# Patient Record
Sex: Male | Born: 1945 | Hispanic: No | Marital: Married | Smoking: Former smoker
Health system: Southern US, Community
[De-identification: ages and names within clinical notes are randomized; demographics above are authoritative.]

## PROBLEM LIST (undated history)

## (undated) DIAGNOSIS — I2510 Atherosclerotic heart disease of native coronary artery without angina pectoris: Secondary | ICD-10-CM

## (undated) DIAGNOSIS — I447 Left bundle-branch block, unspecified: Secondary | ICD-10-CM

## (undated) DIAGNOSIS — I059 Rheumatic mitral valve disease, unspecified: Secondary | ICD-10-CM

## (undated) DIAGNOSIS — I255 Ischemic cardiomyopathy: Secondary | ICD-10-CM

## (undated) DIAGNOSIS — I5022 Chronic systolic (congestive) heart failure: Secondary | ICD-10-CM

## (undated) DIAGNOSIS — Z9119 Patient's noncompliance with other medical treatment and regimen: Secondary | ICD-10-CM

## (undated) DIAGNOSIS — R7302 Impaired glucose tolerance (oral): Secondary | ICD-10-CM

## (undated) DIAGNOSIS — Z91199 Patient's noncompliance with other medical treatment and regimen due to unspecified reason: Secondary | ICD-10-CM

## (undated) DIAGNOSIS — E785 Hyperlipidemia, unspecified: Secondary | ICD-10-CM

## (undated) DIAGNOSIS — I10 Essential (primary) hypertension: Secondary | ICD-10-CM

## (undated) HISTORY — DX: Impaired glucose tolerance (oral): R73.02

## (undated) HISTORY — DX: Patient's noncompliance with other medical treatment and regimen: Z91.19

## (undated) HISTORY — DX: Left bundle-branch block, unspecified: I44.7

## (undated) HISTORY — DX: Rheumatic mitral valve disease, unspecified: I05.9

## (undated) HISTORY — PX: MITRAL VALVE REPAIR: SHX2039

## (undated) HISTORY — DX: Ischemic cardiomyopathy: I25.5

## (undated) HISTORY — DX: Atherosclerotic heart disease of native coronary artery without angina pectoris: I25.10

## (undated) HISTORY — DX: Hyperlipidemia, unspecified: E78.5

## (undated) HISTORY — DX: Chronic systolic (congestive) heart failure: I50.22

## (undated) HISTORY — PX: APPENDECTOMY: SHX54

## (undated) HISTORY — DX: Essential (primary) hypertension: I10

## (undated) HISTORY — DX: Patient's noncompliance with other medical treatment and regimen due to unspecified reason: Z91.199

---

## 2003-12-17 HISTORY — PX: CORONARY ARTERY BYPASS GRAFT: SHX141

## 2011-08-09 ENCOUNTER — Emergency Department (HOSPITAL_COMMUNITY): Payer: 59

## 2011-08-09 ENCOUNTER — Inpatient Hospital Stay (HOSPITAL_COMMUNITY)
Admission: EM | Admit: 2011-08-09 | Discharge: 2011-08-11 | DRG: 293 | Disposition: A | Discharge status: Home / Self Care | Payer: 59 | Attending: Family Medicine | Admitting: Family Medicine

## 2011-08-09 DIAGNOSIS — I10 Essential (primary) hypertension: Secondary | ICD-10-CM

## 2011-08-09 DIAGNOSIS — I25810 Atherosclerosis of coronary artery bypass graft(s) without angina pectoris: Secondary | ICD-10-CM

## 2011-08-09 DIAGNOSIS — Z9119 Patient's noncompliance with other medical treatment and regimen: Secondary | ICD-10-CM

## 2011-08-09 DIAGNOSIS — I2510 Atherosclerotic heart disease of native coronary artery without angina pectoris: Secondary | ICD-10-CM | POA: Diagnosis present

## 2011-08-09 DIAGNOSIS — Z91199 Patient's noncompliance with other medical treatment and regimen due to unspecified reason: Secondary | ICD-10-CM

## 2011-08-09 DIAGNOSIS — Z951 Presence of aortocoronary bypass graft: Secondary | ICD-10-CM

## 2011-08-09 DIAGNOSIS — I447 Left bundle-branch block, unspecified: Secondary | ICD-10-CM | POA: Diagnosis present

## 2011-08-09 DIAGNOSIS — I509 Heart failure, unspecified: Secondary | ICD-10-CM

## 2011-08-09 DIAGNOSIS — I5023 Acute on chronic systolic (congestive) heart failure: Principal | ICD-10-CM | POA: Diagnosis present

## 2011-08-09 LAB — URINALYSIS, ROUTINE W REFLEX MICROSCOPIC
Bilirubin Urine: NEGATIVE
Ketones, ur: NEGATIVE mg/dL
Nitrite: NEGATIVE
Protein, ur: NEGATIVE mg/dL
Specific Gravity, Urine: 1.009 (ref 1.005–1.030)
Urobilinogen, UA: 0.2 mg/dL (ref 0.0–1.0)

## 2011-08-09 LAB — COMPREHENSIVE METABOLIC PANEL
ALT: 34 U/L (ref 0–53)
CO2: 28 mEq/L (ref 19–32)
Calcium: 9.1 mg/dL (ref 8.4–10.5)
GFR calc Af Amer: >60 mL/min (ref >60)
GFR calc non Af Amer: >60 mL/min (ref >60)
Glucose, Bld: 114 mg/dL — ABNORMAL HIGH (ref 70–99)
Sodium: 140 mEq/L (ref 135–145)
Total Bilirubin: 0.5 mg/dL (ref 0.3–1.2)

## 2011-08-09 LAB — CBC
Platelets: 214 10*3/uL (ref 150–400)
RDW: 17.1 % — ABNORMAL HIGH (ref 11.5–15.5)
WBC: 8.1 10*3/uL (ref 4.0–10.5)

## 2011-08-09 LAB — DIFFERENTIAL
Basophils Absolute: 0 10*3/uL (ref 0.0–0.1)
Eosinophils Absolute: 0 10*3/uL (ref 0.0–0.7)
Eosinophils Relative: 0 % (ref 0–5)

## 2011-08-09 LAB — PROTIME-INR: INR: 1.17 (ref 0.00–1.49)

## 2011-08-09 LAB — GLUCOSE, CAPILLARY

## 2011-08-09 LAB — APTT: aPTT: 32 seconds (ref 24–37)

## 2011-08-09 NOTE — H&P (Signed)
Irish Piech is an 65 y.o. male.   Allergies: NKDA Code Status: Full Code  Chief Complaint: Shortness of Breath  HPI: Mr. Addie presents with dyspnea that has been present for approximately 1.5 months; he has a history of prior CABG but no current medical care.  He had a worsening of his breathing and swelling today that prompted him to present to urgent care and he was subsequently transferred to [facility] ED for further evaluation.  He states that he noticed that his symptoms were worsening over the past week when he was trying to walk to a connecting flight while traveling for work.  He travels frequently for his job.  He denies any hemoptysis but does report that his left leg seemed to be swollen before the right leg at the onset of his symptoms but that it seems to be better now.    Review of Systems  Constitutional: Negative for fever, chills, weight loss, malaise/fatigue and diaphoresis.  HENT: Negative for hearing loss.   Eyes: Negative.   Respiratory: Positive for cough, sputum production and shortness of breath. Negative for hemoptysis and wheezing.        Clear sputum production chronically; has improved following azithromycin 2 weeks ago  Cardiovascular: Negative for chest pain, palpitations and orthopnea.       Dyspnea better with laying  Gastrointestinal: Negative for nausea, vomiting, abdominal pain, diarrhea and constipation.  Genitourinary: Negative for dysuria, urgency and frequency.  Musculoskeletal: Negative.   Skin: Negative.   Neurological: Negative for dizziness, tingling, tremors, focal weakness, weakness and headaches.  Endo/Heme/Allergies: Negative.   Psychiatric/Behavioral: Negative.     PMHx: CAD s/p CABG and ? valvuoplasty HTN - not currently treated Does not currently have medical care but was to see [facility]iology in October to establish.  Current Medications: No current daily meds  Tylenol PM prn sleep Recently finished course of Azithromycin for  URI ~2 weeks ago   PSHx: 4 Vessel CABG with valvuoplasty - performed in [place] - Oct 2007 Appendectomy  PFHx: + Early cardiac disease - CA in his sister - No diabetes - No clotting disorders  Social Hx: Lives in both [place] and [place] frequently for work Drinks causally - smoker as a teenager but non in past 45 years; no current illicit but does admit to prior use during the 1970s.  OBJECTIVE: Vitals: 98.4oF 171/172mmHg 101bpm 18resp  97% on 2 L  Physical Exam  Constitutional: He is oriented to person, place, and time. He appears well-developed and well-nourished. No distress.  HENT:  Head: Normocephalic and atraumatic.  Eyes: EOM are normal. Pupils are equal, round, and reactive to light. No scleral icterus.  Neck: Neck supple. No JVD present. No tracheal deviation present. No thyromegaly present.  Cardiovascular: Normal rate.  Exam reveals gallop and S4.   Murmur heard.  Systolic murmur is present with a grade of 2/6       Murmur heard best at left upper sternal boarder   Respiratory: Effort normal. No stridor. No respiratory distress. He has no wheezes.       Bibasilar Crackles   GI: Soft. Bowel sounds are normal. He exhibits no distension and no mass. There is no tenderness. There is no rebound and no guarding.  Musculoskeletal: He exhibits edema. He exhibits no tenderness.       Negative homans Left leg with 3+/4 edema R Leg with 2+/4  Pulses 2+/4 B  Neurological: He is alert and oriented to person, place,  and time.  Skin: Skin is warm and dry. He is not diaphoretic.  Psychiatric: He has a normal mood and affect. His behavior is normal.     Results for orders placed during the hospital encounter of 08/09/11 (from the past 48 hour(s))  DIFFERENTIAL     Status: Abnormal   Collection Time   08/09/11  7:11 PM      Component Value Range Comment   Neutrophils Relative 81 (*) 43 - 77 (%)    Neutro Abs 6.6  1.7 - 7.7 (K/uL)    Lymphocytes Relative 14   12 - 46 (%)    Lymphs Abs 1.1  0.7 - 4.0 (K/uL)    Monocytes Relative 4  3 - 12 (%)    Monocytes Absolute 0.4  0.1 - 1.0 (K/uL)    Eosinophils Relative 0  0 - 5 (%)    Eosinophils Absolute 0.0  0.0 - 0.7 (K/uL)    Basophils Relative 0  0 - 1 (%)    Basophils Absolute 0.0  0.0 - 0.1 (K/uL)   CBC     Status: Abnormal   Collection Time   08/09/11  7:11 PM      Component Value Range Comment   WBC 8.1  4.0 - 10.5 (K/uL)    RBC 4.80  4.22 - 5.81 (MIL/uL)    Hemoglobin 13.8  13.0 - 17.0 (g/dL)    HCT 16.1  09.6 - 04.5 (%)    MCV 86.3  78.0 - 100.0 (fL)    MCH 28.8  26.0 - 34.0 (pg)    MCHC 33.3  30.0 - 36.0 (g/dL)    RDW 40.9 (*) 81.1 - 15.5 (%)    Platelets 214  150 - 400 (K/uL)   PROTIME-INR     Status: Normal   Collection Time   08/09/11  7:11 PM      Component Value Range Comment   Prothrombin Time 15.1  11.6 - 15.2 (seconds)    INR 1.17  0.00 - 1.49    APTT     Status: Normal   Collection Time   08/09/11  7:11 PM      Component Value Range Comment   aPTT 32  24 - 37 (seconds)   PRO B NATRIURETIC PEPTIDE     Status: Abnormal   Collection Time   08/09/11  7:11 PM      Component Value Range Comment   BNP, POC 4558.0 (*) 0 - 125 (pg/mL)   COMPREHENSIVE METABOLIC PANEL     Status: Abnormal   Collection Time   08/09/11  7:11 PM      Component Value Range Comment   Sodium 140  135 - 145 (mEq/L)    Potassium 4.6  3.5 - 5.1 (mEq/L)    Chloride 104  96 - 112 (mEq/L)    CO2 28  19 - 32 (mEq/L)    Glucose, Bld 114 (*) 70 - 99 (mg/dL)    BUN 29 (*) 6 - 23 (mg/dL)    Creatinine, Ser 9.14  0.50 - 1.35 (mg/dL)    Calcium 9.1  8.4 - 10.5 (mg/dL)    Total Protein 6.3  6.0 - 8.3 (g/dL)    Albumin 3.2 (*) 3.5 - 5.2 (g/dL)    AST 33  0 - 37 (U/L)    ALT 34  0 - 53 (U/L)    Alkaline Phosphatase 102  39 - 117 (U/L)    Total Bilirubin 0.5  0.3 - 1.2 (mg/dL)  GFR calc non Af Amer >60  >60 (mL/min)    GFR calc Af Amer >60  >60 (mL/min)   POCT I-STAT TROPONIN I     Status: Normal    Collection Time   08/09/11  7:38 PM      Component Value Range Comment   Troponin i, poc 0.02  0.00 - 0.08 (ng/mL)    Comment 3            URINALYSIS, ROUTINE W REFLEX MICROSCOPIC     Status: Normal   Collection Time   08/09/11  8:07 PM      Component Value Range Comment   Color, Urine YELLOW  YELLOW     Appearance CLEAR  CLEAR     Specific Gravity, Urine 1.009  1.005 - 1.030     pH 6.0  5.0 - 8.0     Glucose, UA NEGATIVE  NEGATIVE (mg/dL)    Hgb urine dipstick NEGATIVE  NEGATIVE     Bilirubin Urine NEGATIVE  NEGATIVE     Ketones, ur NEGATIVE  NEGATIVE (mg/dL)    Protein, ur NEGATIVE  NEGATIVE (mg/dL)    Urobilinogen, UA 0.2  0.0 - 1.0 (mg/dL)    Nitrite NEGATIVE  NEGATIVE     Leukocytes, UA NEGATIVE  NEGATIVE  MICROSCOPIC NOT DONE ON URINES WITH NEGATIVE PROTEIN, BLOOD, LEUKOCYTES, NITRITE, OR GLUCOSE <1000 mg/dL.   Dg Chest 2 View  08/09/2011  *RADIOLOGY REPORT*  Clinical Data: Shortness of breath, chest tightness.  CHEST - 2 VIEW  Comparison: Outside film performed earlier today.  Findings: Prior median sternotomy and valve replacement/CABG. There is cardiomegaly.  Bibasilar opacities are noted with small bilateral effusions.  Cannot exclude pneumonia.  No overt edema. No acute bony abnormality.  No change.  IMPRESSION: Cardiomegaly.  No overt edema.  Bibasilar atelectasis or infiltrates with small bilateral effusions.  Original Report Authenticated By: Cyndie Chime, M.D.   ECG Interpretation:  NSR, with ? Inferior and lateral ischemia with J point depression   Assessment/Plan Mr. Alfonso is a 65 yo male admitted with Congestive Heart Failure with a history significant for 4 vessel CABG and valvuloplasty.  He does not currently have any medical care.  1. Congestive Heart Failure - BNP elevated to 4558 - new diagnosis  * Lasix 60mg  IV   * Nitroglycerin 0.5inch paste q 6o  * Saline Locked IV  [ ]  2D ECHO with Contrast  - Consult Cardiology in AM    2. Dyspnea - most likely  related to CHF but consider DVT in setting of lateralization of swelling and frequent travel and acuity of sx worsening following air travel  [ ]  D-Dimer - if positive CTA and LE Dopplers   3. HTN - Currently elevated - no meds as OP.     *Nitro patch  4. CAD s/p CABG - Rule out MI - negative CEs X 1 - ECG shows ?ischemia in inferior and lateral leads  [ ]  Repeat ECG in AM  [ ]  CE cycling   * ASA   5. Risk Stratification  [ ]  HbA1c  [ ]  Fasting Lipid Panel  [ ]  TSH  FEN -  - Saline Locked IV  - Diet Clears  PPx:  Heparin 5000 Units sq tid  Disposition:  Will consider Cardiology consult in Morning - he is to be a [facility] pt; will risk stratify and disscuss appropriate medical management with the patient  Million Maharaj 08/09/2011, 10:44 PM

## 2011-08-10 ENCOUNTER — Inpatient Hospital Stay (HOSPITAL_COMMUNITY): Payer: 59

## 2011-08-10 DIAGNOSIS — I509 Heart failure, unspecified: Secondary | ICD-10-CM

## 2011-08-10 LAB — CARDIAC PANEL(CRET KIN+CKTOT+MB+TROPI)
CK, MB: 3.2 ng/mL (ref 0.3–4.0)
Relative Index: 3.2 — ABNORMAL HIGH (ref 0.0–2.5)
Total CK: 73 U/L (ref 7–232)
Troponin I: <0.3 ng/mL (ref <0.30)

## 2011-08-10 LAB — GLUCOSE, CAPILLARY: Glucose-Capillary: 70 mg/dL (ref 70–99)

## 2011-08-10 LAB — BASIC METABOLIC PANEL
BUN: 22 mg/dL (ref 6–23)
Creatinine, Ser: 0.93 mg/dL (ref 0.50–1.35)
GFR calc Af Amer: >60 mL/min (ref >60)
GFR calc non Af Amer: >60 mL/min (ref >60)
Glucose, Bld: 83 mg/dL (ref 70–99)

## 2011-08-10 LAB — TSH: TSH: 3.166 u[IU]/mL (ref 0.350–4.500)

## 2011-08-10 LAB — CBC
Platelets: 201 10*3/uL (ref 150–400)
RDW: 16.9 % — ABNORMAL HIGH (ref 11.5–15.5)
WBC: 7.3 10*3/uL (ref 4.0–10.5)

## 2011-08-10 LAB — D-DIMER, QUANTITATIVE: D-Dimer, Quant: 1.15 ug/mL-FEU — ABNORMAL HIGH (ref 0.00–0.48)

## 2011-08-10 LAB — LIPID PANEL: LDL Cholesterol: 108 mg/dL — ABNORMAL HIGH (ref 0–99)

## 2011-08-10 LAB — PHOSPHORUS: Phosphorus: 4.1 mg/dL (ref 2.3–4.6)

## 2011-08-10 LAB — MAGNESIUM: Magnesium: 2.2 mg/dL (ref 1.5–2.5)

## 2011-08-10 LAB — HEMOGLOBIN A1C: Mean Plasma Glucose: 137 mg/dL — ABNORMAL HIGH (ref <117)

## 2011-08-10 MED ORDER — IOHEXOL 300 MG/ML  SOLN
100.0000 mL | Freq: Once | INTRAMUSCULAR | Status: AC | PRN
  Administered 2011-08-10: 100 mL via INTRAVENOUS

## 2011-08-11 ENCOUNTER — Inpatient Hospital Stay (HOSPITAL_COMMUNITY): Payer: 59

## 2011-08-11 DIAGNOSIS — I5023 Acute on chronic systolic (congestive) heart failure: Secondary | ICD-10-CM

## 2011-08-11 LAB — BASIC METABOLIC PANEL
BUN: 17 mg/dL (ref 6–23)
Creatinine, Ser: 0.88 mg/dL (ref 0.50–1.35)
GFR calc non Af Amer: >60 mL/min (ref >60)
Glucose, Bld: 95 mg/dL (ref 70–99)
Potassium: 3.8 mEq/L (ref 3.5–5.1)

## 2011-08-11 LAB — URINE CULTURE: Culture  Setup Time: 201208250208

## 2011-08-11 LAB — CBC
HCT: 41.7 % (ref 39.0–52.0)
Hemoglobin: 13.6 g/dL (ref 13.0–17.0)
MCH: 27.9 pg (ref 26.0–34.0)
MCHC: 32.6 g/dL (ref 30.0–36.0)
MCV: 85.6 fL (ref 78.0–100.0)
RDW: 16.9 % — ABNORMAL HIGH (ref 11.5–15.5)

## 2011-08-12 ENCOUNTER — Telehealth: Payer: Self-pay | Admitting: Internal Medicine

## 2011-08-12 NOTE — Telephone Encounter (Signed)
Pt's wife calling to say pt was to see dr allred this week after being in hospital this weekend, also has questions re back pain

## 2011-08-12 NOTE — Telephone Encounter (Signed)
To see Tereso Newcomer 1-2 weeks -per Dr Amedeo Plenty note

## 2011-08-12 NOTE — Telephone Encounter (Signed)
Pt can see Jacolyn Reedy PA on 08/21/11 per Dr Johney Frame  Back is better today after wife rubbed it  Thinks is due to hospital bed

## 2011-08-12 NOTE — Telephone Encounter (Signed)
Called pt's wife to set up appt , scott booked until 9-10, and michelle until 9-5, Pt had back/chest discomfort last night, wife rubbed his back and chest and felt better, no sweating or "pain", felt better today but concerned that he needs appt this week, also wants results of echo

## 2011-08-20 ENCOUNTER — Ambulatory Visit (INDEPENDENT_AMBULATORY_CARE_PROVIDER_SITE_OTHER): Payer: Self-pay | Admitting: Family Medicine

## 2011-08-20 DIAGNOSIS — R079 Chest pain, unspecified: Secondary | ICD-10-CM

## 2011-08-20 NOTE — Progress Notes (Signed)
  Subjective:    Patient ID: Jordan Carter, male    DOB: April 01, 1946, 65 y.o.   MRN: [id number]  HPI    Review of Systems     Objective:   Physical Exam        Assessment & Plan:  Patient was not seen..he was to be scheduled with Dr. Ashley Royalty for hospital follow up, he has an apt with cardiology already set.

## 2011-08-21 ENCOUNTER — Encounter: Payer: Self-pay | Admitting: Family Medicine

## 2011-08-22 ENCOUNTER — Inpatient Hospital Stay: Payer: Self-pay | Admitting: Family Medicine

## 2011-08-22 NOTE — Consult Note (Signed)
NAMEEBB, CARELOCK NO.:  [id number]  MEDICAL RECORD NO.:  [id number]  LOCATION:  4709                         FACILITY:  [facility]  PHYSICIAN:  Hillis Range, MD       DATE OF BIRTH:  [birth year]  DATE OF CONSULTATION: DATE OF DISCHARGE:                                CONSULTATION   REQUESTING PHYSICIAN:  Dr. Ammie Dalton.  REASON FOR CONSULTATION:  Heart failure.  HISTORY OF PRESENT ILLNESS:  Mr. Vanatta is a 65 year old gentleman with a known history of coronary artery disease and ischemic cardiomyopathy who now presents with progressive heart failure.  The patient reports having coronary artery disease and mitral valve disease for which he underwent a coronary artery bypass grafting and a mitral valve repair approximately 5 years ago in [place].  He states that by echocardiogram his ejection fraction was in the 30s at that time.  Interestingly, he has not been seen by cardiologist in approximately 4 years and takes no medications.  He states that over the past 6-8 weeks, he has had progressive shortness of breath and lower extremity edema.  He reports dyspnea with moderate activities.  He has not had significant orthopnea or PND, though he does notice a clear cough occasionally.  He has also had chronic difficulties with lower extremity edema but over the past 6 weeks he has found that this has also significantly worsened.  He denies any chest pain, palpitations, dizziness, presyncope or syncope. Presently, he is resting comfortably.  He has been initiated on intravenous Lasix and has had significant improvement in shortness of breath with diuresis.  PAST MEDICAL HISTORY: 1. Ischemic cardiomyopathy (ejection fraction previously in the 30s     per the patient). 2. Coronary artery disease status post CABG in 2005, with mitral valve     repair at that time. 3. Left bundle-branch block. 4. Hypertension. 5. Medical noncompliance.  MEDICATIONS:  None.  ALLERGIES:   None.  SOCIAL HISTORY:  The patient lives partially in [place] and partially in [place].  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 He denies tobacco or drug use and rarely drinks alcohol.  FAMILY HISTORY:  He states that multiple maternal relatives had "heart problems. "  REVIEW OF SYSTEMS:  All systems are reviewed and negative except as outlined in the HPI above.  PHYSICAL EXAMINATION:  Telemetry reveals sinus rhythm with a left bundle- branch block. VITAL SIGNS:  Blood pressure 126/72, heart rate 67, respirations 20, sats 95% on room air, afebrile. GENERAL:  The patient is a well-appearing gentleman in no acute distress.  He is alert and oriented x3. HEENT:  Normocephalic, atraumatic.  Sclerae clear.  Conjunctivae pink. Oropharynx clear. NECK:  Supple.  JVP 10 cm. LUNGS:  Few bibasilar rales.  Decreased breath sounds at the right base. Normal work of breathing. HEART:  Regular rate and rhythm.  No murmurs, rubs or gallops.  He does have a wide S2 split. GI:  Soft, nontender, nondistended.  Positive bowel sounds. EXTREMITIES:  No clubbing or cyanosis.  He does have 1+ edema bilaterally. SKIN:  No ecchymoses or lacerations. MUSCULOSKELETAL:  No deformity or atrophy. PSYCH:  Slightly bizarre  affect. NEURO:  Strength and sensation are intact.  EKG today reveals sinus rhythm with a left bundle-branch block and a QRS duration of 190 milliseconds, the QT interval measures 520 milliseconds.  Echocardiogram is pending.  LABORATORY DATA:  TSH 3.1, hemoglobin A1c 6.4.  Cardiac markers are negative.  Total cholesterol 168, triglycerides 104, HDL 39, LDL 108. Potassium 4.4, creatinine 0.9, hematocrit 40, white blood cell count 7, platelets 201.  Chest x-ray is reviewed and reveals mild interstitial edema.  I have also reviewed the patient's chest CT from August 10, 2011, which reveals no pulmonary emboli.  The patient does have bilateral pleural effusions as  well as pulmonary edema observed.  IMPRESSION:  Mr. Givan is a 65 year old gentleman with a known history of an ischemic cardiomyopathy (he previously says that his ejection fraction was in the 30s) with coronary artery disease and a history of prior coronary artery bypass grafting and mitral valve repair 5 years ago.  He does not appear to have had close Cardiology followup and is presently on no medications.  He presents with acute-on-chronic decompensated systolic congestive heart failure.  He has no symptoms of ischemia at this time.  He also has a left bundle-branch block of an unknown duration.  RECOMMENDATIONS:  At this time, I would recommend that we treat the patient for acute systolic dysfunction with gentle diuresis and sodium restriction.  He has been placed on an ACE inhibitor and I would recommend that this is titrated long term.  In addition, once his heart failure has been stabilized, I would add Coreg.  He has also been appropriately placed on a statin and I would recommend aspirin therapies long term.  We will obtain an echocardiogram to further assess his ejection fraction.  I think that he will require optimization of his medicines in the outpatient setting with a repeat echocardiogram once his medicines have been optimized for 3 months.  As the patient appears to be clinically improved, hopefully he can be discharged within the next 24 hours and the remainder of his workup and management could be performed in our office in the outpatient setting.  We will follow the patient with you during his hospital stay.     Hillis Range, MD    JA/MEDQ  D:  08/10/2011  T:  08/10/2011  Job:  [id number]  cc:   __________Dr. Ammie Dalton  Electronically Signed by Hillis Range MD on 08/22/2011 08:53:11 AM

## 2011-08-29 ENCOUNTER — Encounter: Payer: Self-pay | Admitting: Physician Assistant

## 2011-08-30 ENCOUNTER — Encounter: Payer: Self-pay | Admitting: Physician Assistant

## 2011-08-31 ENCOUNTER — Other Ambulatory Visit: Payer: Self-pay | Admitting: Family Medicine

## 2011-09-01 NOTE — Telephone Encounter (Signed)
Refill request

## 2011-09-02 ENCOUNTER — Encounter: Payer: Self-pay | Admitting: Physician Assistant

## 2011-09-05 NOTE — Telephone Encounter (Signed)
Patient seen in hospital by inpatient team.  Never showed up for hospital follow up and has never been seen in our clinic.  Will need appointment before refills are given.

## 2011-09-06 NOTE — H&P (Signed)
NAMEGAIGE, Jordan Carter                 ACCOUNT NO.:  [id number]  MEDICAL RECORD NO.:  [id number]  LOCATION:  4709                         FACILITY:  [facility]  PHYSICIAN:  Jordan Carter, M.D.DATE OF BIRTH:  1946/07/16  DATE OF ADMISSION:  08/09/2011 DATE OF DISCHARGE:                             HISTORY & PHYSICAL   CHIEF COMPLAINT:  Shortness of breath.  CODE STATUS:  Full code.  ALLERGIES:  No known drug allergies.  HISTORY OF PRESENT ILLNESS:  Mr. Jordan Carter presents with dyspnea that has been present for approximately 1-1/2 month.  He has a history of prior CABG, but no current medical care.  He has worsening of his breathing and swelling today that prompted him to present to Urgent Care and subsequently transferred to [facility] the ED for further evaluation. He states he noticed that his symptoms were worsening over the past week when he was trying to walk to the connecting flight while traveling for work.  He travels frequently for his job.  He denies any hemoptysis, but does report that his left leg seemed to be swollen before the right leg at the onset of symptoms, but that seems to be better now.  REVIEW OF SYSTEMS:  CONSTITUTIONAL:  Negative for chills, weight loss, malaise, fatigue, or diaphoresis.  HEENT:  Negative for hearing loss. Negative for eyes.  RESPIRATORY:  Positive for cough, sputum production, and shortness of breath.  Negative for hemoptysis or wheezing.  He does have clear sputum production that is a chronic thing, but has improved recently following azithromycin that he was given 2 weeks ago at Urgent Care.  CARDIOVASCULAR:  Negative for chest pain, palpitations, or orthopnea.  His shortness of breath is actually better with lying.  GI: Negative for nausea, vomiting, abdominal pain, diarrhea, or constipation.  GU:  Negative for dysuria, urgency, or frequency. MUSCULOSKELETAL:  Negative.  SKIN:  Negative.  NEUROLOGICAL:  Negative for dizziness, tingling,  tremors, focal weakness, or headache. Remaining of 12-point review of systems is otherwise negative unless noted above.  PAST MEDICAL HISTORY: 1. Coronary artery disease, status post CABG and questionable     valvuloplasty. 2. Hypertension, currently not treated. 3. He does not currently have medical care, but was to see [facility]     Cardiology in October to establish cardiology care.  CURRENT MEDICATIONS:  No current daily medications.  He does use Tylenol PM p.r.n. for sleep.  He did recently finished a course of azithromycin for URI about 2 weeks ago.  PAST SURGICAL HISTORY: 1. He has had a 4-vessel CABG with valvuloplasty that was performed in     [place] in October 2007. 2. Appendectomy.  PAST FAMILY HISTORY:  Positive for early cardiac disease.  Sister did have cancer.  No diabetes, no clotting disorders.  SOCIAL HISTORY:  He lives both in [place] and [place].  He travels frequently for work.  He does drink casually.  He smoked as a teenager, but has not in the past 45 years.  No current illicit, but does admit to prior use during the 1970s.  OBJECTIVE:  VITAL SIGNS:  Temperature 48.4 degrees Fahrenheit, blood pressure of 171/116 mmHg, pulse of 101, respirations  of 18, and 97% on 2 liters. CONSTITUTIONAL:  He is oriented to person, place, and time.  Appears well developed, well nourished, in no distress. HEENT:  Normocephalic and atraumatic.  Eyes, extraocular muscles are normal.  Pupils are equally round and reactive to light.  No scleral icterus. NECK:  Supple.  There was no JVD.  No tracheal deviation.  No thyromegaly. CARDIOVASCULAR:  Normal rate.  Exam reveals an S4 gallop.  There is a 2/6 systolic murmur that is heard best at the left upper sternal border. Normal respiratory effort.  No stridor, no respiratory distress.  He has no wheezes, but hoes have some bibasilar crackles. GI:  Soft.  Bowel sounds normal.  Exhibits no distention, no masses,  no tenderness, no rebound, no guarding. MUSCULOSKELETAL:  Bilateral edema, no tenderness.  Negative Homans. Left leg is 3+/4 edema.  Right leg is 2+/4 edema.  Pulses are 2+/4 bilaterally. NEUROLOGICAL:  He is alert and oriented to person, place, and time. SKIN:  Warm and dry.  He is not diaphoretic. PSYCHIATRIC:  He has normal mood and affect.  His behavior is normal.  SIGNIFICANT LABORATORY DATA:  On admission, his CBC revealed white count of 8.1, hemoglobin was 13.8, hematocrit of 41.4, and platelets of 214. His RDW was slightly elevated at 17.1.  PT/INR was within normal limits with 15.1 and 1.17.  His BNP was elevated at 4558.  CMP was within normal limits except for an elevated BUN at 29 and a glucose of 114. Creatinine was normal at 0.97.  His albumin was slightly low at 3.2. Point-of-care troponin was negative at 0.02.  Urinalysis was unremarkable.  IMAGING:  He did have a 2-D chest x-ray that showed no overt edema.  He did have some bibasilar atelectasis with questionable infiltrates and small bilateral effusions.  His ECG interpretation was normal sinus rhythm with questionable inferior and lateral ischemia with J-point depression.  ASSESSMENT/PLAN:  Mr. Jordan Carter is a 65 year old male admitted with congestive heart failure with significant history for 4-vessel coronary artery bypass grafting and valvuloplasty.  He does not currently have any medical care. 1. Congestive heart failure.  His BNP is elevated at 4558.  This is a     new diagnosis for him.  We will give him Lasix 60 mg IV, provide     him nitroglycerin with 1/2 inch paste q.6 h.  He is saline locked.     We will get a 2-D echo with contrast and consult Cardiology in the     morning. 2. Dyspnea, most likely related to congestive heart failure, but we do     have to consider DVT in the setting of lateralization of swelling,     frequent travel, and acuity of symptoms worsening following air     travel.  We have a  D-dimer pending.  If it is positive, we will CTA     and get lower extremity Dopplers. 3. Hypertension.  Blood pressure is currently elevated.  He has no     meds as an outpatient.  He is on a nitro patch currently and we     will continue that and consider the use of hydralazine if he     becomes unstable. 4. Coronary artery disease, status post coronary artery bypass     grafting, rule out myocardial infarction.  So far, he has negative     cardiac enzymes x1.  His ECG does show questionable ischemia in     inferior and lateral leads.  We are going to cycle his cardiac     enzymes, repeat EKG in the morning and we will start him on aspirin     tonight. 5. Risk stratification.  We will get hemoglobin A1c, fasting lipid     panel, and TSH. 6. Fluids, electrolytes, nutrition.  He is saline locked IV.  He is on     a clear diet. 7. Prophylaxis.  Heparin 5000 units subcu t.i.d.  DISPOSITION:  We will consider cardiology consult morning.  He used to be a Adult nurse patient.  We will risk stratify and discuss appropriate medical management with the patient when we have more information.    ______________________________ Gaspar Bidding, DO   ______________________________ Jordan Roach Triniti Gruetzmacher, M.D.    MR/MEDQ  D:  08/10/2011  T:  08/10/2011  Job:  [id number]  Electronically Signed by Gaspar Bidding  on 09/04/2011 05:11:23 PM Electronically Signed by Acquanetta Belling M.D. on 09/06/2011 03:09:55 PM

## 2011-09-06 NOTE — Discharge Summary (Signed)
NAMEEAGLE, PITTA                 ACCOUNT NO.:  [id number]  MEDICAL RECORD NO.:  [id number]  LOCATION:  4709                         FACILITY:  [facility]  PHYSICIAN:  Leighton Roach Caidynce Muzyka, M.D.DATE OF BIRTH:  06-15-1946  DATE OF ADMISSION:  08/09/2011 DATE OF DISCHARGE:  08/11/2011                              DISCHARGE SUMMARY   ADMISSION DIAGNOSES:  Shortness of breath, congestive heart failure.  DISCHARGE DIAGNOSES:  Acute decompensated Heart Failure,  systolic heart failure, dyspnea, hypertension, coronary artery disease, status post coronary artery bypass graft.  HOSPITAL COURSE:  Mr. Glasscock presented to [facility] ED for dyspnea that had been progressively worsening over a month and a half.  He had a prior history of CABG, but had not been receiving any medical care as of last couple of years.  He noticed that his breathing had been worsening but that was most significant recently while trying to catch up connecting flight in an airport while he was having to walk briskly between flights.  In addition to his shortness of breath, he does report some bilateral extremity swelling, although his left leg did seem to be swollen more so than his right.  He had also recently been seen a couple of weeks prior for an respiratory tract infection and was provided azithromycin at the urgent care center.  These symptoms have seemed to improve.  His dyspnea also did improve while laying supine and he reported no orthopnea.  On evaluation in the ED, he was found to have a BNP of 4500, and his symptoms seem to resolve with a nitroglycerin paste and after being diuresed with Lasix.  He is also found to be hypertensive in the ED and blood pressure of 171/116.  Elevated blood pressures did persist during his hospitalization, although they were never higher than what they were at his initial presentation.  He also remained to be asymptomatic with these elevated pressures.  Other course of this  hospitalization, due to the lower extremity swelling, recent air travel, a D-dimer was obtained that was slightly elevated, and subsequent CTA rule out a pulmonary embolus.  He did receive an echocardiogram and Cardiology consult with an echocardiogram that showed left ventricle with global hypokinesis and an ejection fraction of 20%. Aortic valve showed some sclerosis without stenosis, mitral valve showed annular calcification with questionable mitral ring.  There is mild regurgitation, but the atrium was mildly dilated.  Right ventricle cavity size was moderately dilated, and the systolic function was moderately reduced.  Right atrium was moderately dilated.  Pulmonary arteries revealed a pulmonary artery peak pressure of 44 mmHg.  This area was risk stratified and found to have an LDL of 108 with an HDL of 39 and during the admission day of his stay, he did a set of cardiac enzymes that were cycled were negative.  Cardiology started him on aspirin, beta-blocker, and ACE inhibitor, and maintained him on nitroglycerin while in the hospital.  He did show clinical improvement throughout the hospitalization and was set up with followup to have continued medical care.  DISCHARGE MEDICATIONS: 1. Aspirin 325 mg p.o. daily. 2. Coreg 1 tablet p.o. b.i.d. 3. Lasix 1 tablet p.o. b.i.d.  4. Lisinopril 5 mg p.o. daily. 5. Simvastatin 10 mg p.o. daily.  FOLLOWUP:  He is to follow up at [facility] in the next 1-2 weeks.  He is to call and make this appointment.  He is asked to follow up with [facility] also in 1-2 weeks, also call to follow up with this appointment.  ISSUES AT FOLLOWUP:  Needs a BMET in the next 1-2 weeks to monitor his kidney function and start him on ACE inhibitor also go over the results of his echocardiogram with him.    ______________________________ Gaspar Bidding, DO   ______________________________ Leighton Roach Vyom Brass,  M.D.    MR/MEDQ  D:  09/04/2011  T:  09/04/2011  Job:  [id number]  Electronically Signed by Gaspar Bidding  on 09/05/2011 09:01:20 AM Electronically Signed by Acquanetta Belling M.D. on 09/06/2011 03:11:05 PM

## 2011-09-19 ENCOUNTER — Encounter: Payer: Self-pay | Admitting: Physician Assistant

## 2011-09-19 ENCOUNTER — Ambulatory Visit (INDEPENDENT_AMBULATORY_CARE_PROVIDER_SITE_OTHER): Payer: 59 | Admitting: Physician Assistant

## 2011-09-19 VITALS — BP 160/84 | HR 75 | Ht 70.0 in | Wt 189.0 lb

## 2011-09-19 DIAGNOSIS — I10 Essential (primary) hypertension: Secondary | ICD-10-CM | POA: Insufficient documentation

## 2011-09-19 DIAGNOSIS — E785 Hyperlipidemia, unspecified: Secondary | ICD-10-CM

## 2011-09-19 DIAGNOSIS — I2510 Atherosclerotic heart disease of native coronary artery without angina pectoris: Secondary | ICD-10-CM

## 2011-09-19 DIAGNOSIS — I5022 Chronic systolic (congestive) heart failure: Secondary | ICD-10-CM | POA: Insufficient documentation

## 2011-09-19 DIAGNOSIS — I059 Rheumatic mitral valve disease, unspecified: Secondary | ICD-10-CM

## 2011-09-19 DIAGNOSIS — I2589 Other forms of chronic ischemic heart disease: Secondary | ICD-10-CM

## 2011-09-19 DIAGNOSIS — R7302 Impaired glucose tolerance (oral): Secondary | ICD-10-CM

## 2011-09-19 DIAGNOSIS — I255 Ischemic cardiomyopathy: Secondary | ICD-10-CM

## 2011-09-19 MED ORDER — FUROSEMIDE 40 MG PO TABS: 40.0000 mg | ORAL_TABLET | Freq: Two times a day (BID) | ORAL | Status: DC

## 2011-09-19 MED ORDER — CARVEDILOL 6.25 MG PO TABS: 6.2500 mg | ORAL_TABLET | Freq: Two times a day (BID) | ORAL | Status: DC

## 2011-09-19 MED ORDER — SIMVASTATIN 10 MG PO TABS: 10.0000 mg | ORAL_TABLET | Freq: Every day | ORAL | Status: DC

## 2011-09-19 MED ORDER — LOSARTAN POTASSIUM 50 MG PO TABS: 50.0000 mg | ORAL_TABLET | Freq: Every day | ORAL | Status: DC

## 2011-09-19 NOTE — Progress Notes (Signed)
History of Present Illness: Primary Electrophysiologist:  Dr. Hillis Range   Jordan Carter is a 65 y.o. male who presents for post hospital follow up.  He has a h/o CAD and mitral valve disease, s/p CABG and MV repair in [place] in 2007, ischemic cardiomyopathy with previous EF 30% and HTN.  He was not taking any medications for several years when he presented to [facility] 8/24-8/26 with a/c systolic CHF.  He was seen by Dr. Johney Frame.  Cardiac enzymes were negative.  DDimer was elevated and Chest CTA was negative for pulmonary emboli.  He was diuresed with IV lasix and CHF meds were initiated.  Echo demonstrated EF 20%.  Pertinent labs: K 3.8, creatinine 0.88, Hgb 13.6, ALT 34, A1C 6.4, BNP 4558, TC 168, TG 104, HDL 39, LDL 108, TSH 3.166.    The patient denies chest pain, shortness of breath, syncope, orthopnea, PND or significant pedal edema.  Weights stable.  Describes class 2 symptoms.  Feels great and exercising daily.  Apparently was taking a lot of alternative therapies previously.  Was discharged on simvastatin, but is not taking for unclear reasons.  Denies h/o myopathy or liver diseases.  Does complain of cough from ACE.  Past Medical History  Diagnosis Date  . Chronic systolic heart failure     Ischemic CM; Echo 8/12: mild LVH, EF 20%, mild MR, mild LAE, mod RAE, mod RVE, mod reduced RVSF, PASP 44;  admx to hosp 07/2011  . CAD (coronary artery disease)     s/p CABG in [place]  . LBBB (left bundle branch block)   . Hypertension   . Ischemic cardiomyopathy   . Mitral valve disease     s/p MV repair 2007 in [place]  . Medical non-compliance   . Glucose intolerance (impaired glucose tolerance)   . HLD (hyperlipidemia)     Current Outpatient Prescriptions  Medication Sig Dispense Refill  . aspirin 325 MG EC tablet Take 325 mg by mouth daily.        . carvedilol (COREG) 6.25 MG tablet Take 1 tablet (6.25 mg total) by mouth 2 (two) times daily with a meal.  180 tablet  3  . furosemide  (LASIX) 40 MG tablet Take 1 tablet (40 mg total) by mouth 2 (two) times daily.  180 tablet  3  . losartan (COZAAR) 50 MG tablet Take 1 tablet (50 mg total) by mouth daily.  90 tablet  3  . simvastatin (ZOCOR) 10 MG tablet Take 1 tablet (10 mg total) by mouth at bedtime.  90 tablet  3    Allergies: No Known Allergies  Social Hx:  Non smoker  ROS:  Please see the history of present illness.  All other systems reviewed and negative.   Vital Signs: BP 160/84  Pulse 75  Ht 5\' 10"  (1.778 m)  Wt 189 lb (85.73 kg)  BMI 27.12 kg/m2  PHYSICAL EXAM: Well nourished, well developed, in no acute distress HEENT: normal Neck: no JVD Cardiac:  normal S1, S2; RRR; no murmur Lungs:  clear to auscultation bilaterally, no wheezing, rhonchi or rales Abd: soft, nontender, no hepatomegaly Ext: trace bilateral ankle edema Skin: warm and dry Neuro:  CNs 2-12 intact, no focal abnormalities noted Psych: normal affect   EKG:  NSR, HR 75, LBBB  ASSESSMENT AND PLAN:

## 2011-09-19 NOTE — Assessment & Plan Note (Signed)
Titrate medications.  Follow up with Dr. Johney Frame in 6 weeks.  Obtain echo in 3 months.  If no improvement in EF, consider ICD.  Discussed with patient and wife today.

## 2011-09-19 NOTE — Assessment & Plan Note (Signed)
Discussed with patient today.  Refer to primary care.

## 2011-09-19 NOTE — Assessment & Plan Note (Signed)
Explained importance of statin therapy for secondary prevention.  Restart simvastatin 10 mg QHS.  Check FLP and LFTs in 3 months.

## 2011-09-19 NOTE — Assessment & Plan Note (Signed)
Stable.  No angina.  Given recent admxn for CHF and apparent decrease in EF, arrange Lexiscan Myoview.  Obtain records from [place].

## 2011-09-19 NOTE — Assessment & Plan Note (Signed)
Notes cough from ACE.  D/c lisinopril.  Start Cozaar 50 mg QD.  Increase coreg to 6.25 mg BID.  Continue Lasix.  Volume appears stable and NYHA class 2.  Check BMET today.  Follow up with me in 3 weeks to advance medicines further.  Patient and wife enquired about alternative therapies.  Counseled both of them today on morbidity and mortality benefit of traditional CHF medications.

## 2011-09-19 NOTE — Patient Instructions (Addendum)
Your physician has requested that you have a lexiscan myoview. For further information please visit [email or web address] Please follow instruction sheet, as given.  Your physician recommends that you have lab work today bmp  Your physician has recommended you make the following change in your medication:  stop lisinopril   Start cozaar  Increase carvedilol  You have been referred to [facility]   Your physician recommends that you schedule a follow-up appointment in: 3 weeks with Tereso Newcomer PA  Your physician recommends that you schedule a follow-up appointment in: 6 weeks with Dr. Johney Frame

## 2011-09-19 NOTE — Assessment & Plan Note (Signed)
Uncontrolled.  Wife states BP better at home.  Adjust meds as noted.

## 2011-09-19 NOTE — Assessment & Plan Note (Signed)
MV repair appeared stable on recent echo.

## 2011-09-20 ENCOUNTER — Encounter: Payer: Self-pay | Admitting: *Deleted

## 2011-09-20 ENCOUNTER — Telehealth: Payer: Self-pay | Admitting: Physician Assistant

## 2011-09-20 DIAGNOSIS — I5022 Chronic systolic (congestive) heart failure: Secondary | ICD-10-CM

## 2011-09-20 LAB — BASIC METABOLIC PANEL
CO2: 31 mEq/L (ref 19–32)
Calcium: 8.9 mg/dL (ref 8.4–10.5)
Creatinine, Ser: 0.9 mg/dL (ref 0.4–1.5)
GFR: 87.8 mL/min (ref >60.00)
Potassium: 4.5 mEq/L (ref 3.5–5.1)
Sodium: 138 mEq/L (ref 135–145)

## 2011-09-20 NOTE — Telephone Encounter (Signed)
Patient wife calling back to speak with Lauren. Please call patient wife back .

## 2011-09-20 NOTE — Telephone Encounter (Signed)
I left a message on the pt's cell phone [phone]) to call the office about lab results and recommendations.

## 2011-09-20 NOTE — Telephone Encounter (Signed)
BMET results did not come to me. BUN up some.  Otherwise, labs normal Decrease Lasix to 40 mg QD. Weigh daily and call if:  Weight up 3 lbs in one day, increased swelling or increased dyspnea.  Check BMET in one week.  Tereso Newcomer, PA-C

## 2011-09-20 NOTE — Telephone Encounter (Signed)
I spoke with the pt's wife and made her aware of lab results and medication change.  The pt will come into the office 09/26/11 for repeat BMP.

## 2011-09-26 ENCOUNTER — Other Ambulatory Visit: Payer: 59 | Admitting: *Deleted

## 2011-09-27 ENCOUNTER — Other Ambulatory Visit (INDEPENDENT_AMBULATORY_CARE_PROVIDER_SITE_OTHER): Payer: 59 | Admitting: *Deleted

## 2011-09-27 DIAGNOSIS — I5022 Chronic systolic (congestive) heart failure: Secondary | ICD-10-CM

## 2011-09-27 LAB — BASIC METABOLIC PANEL
BUN: 28 mg/dL — ABNORMAL HIGH (ref 6–23)
CO2: 30 mEq/L (ref 19–32)
Chloride: 103 mEq/L (ref 96–112)
Potassium: 4.7 mEq/L (ref 3.5–5.1)

## 2011-10-14 ENCOUNTER — Encounter: Payer: Self-pay | Admitting: Internal Medicine

## 2011-10-14 ENCOUNTER — Other Ambulatory Visit (INDEPENDENT_AMBULATORY_CARE_PROVIDER_SITE_OTHER): Payer: 59

## 2011-10-14 ENCOUNTER — Ambulatory Visit (INDEPENDENT_AMBULATORY_CARE_PROVIDER_SITE_OTHER): Payer: 59 | Admitting: Internal Medicine

## 2011-10-14 ENCOUNTER — Ambulatory Visit (HOSPITAL_COMMUNITY): Payer: 59 | Attending: Cardiology | Admitting: Radiology

## 2011-10-14 VITALS — Ht 70.0 in | Wt 186.0 lb

## 2011-10-14 VITALS — BP 132/88 | HR 63 | Temp 97.8°F | Resp 16 | Ht 67.75 in | Wt 188.5 lb

## 2011-10-14 DIAGNOSIS — R7303 Prediabetes: Secondary | ICD-10-CM

## 2011-10-14 DIAGNOSIS — Z0000 Encounter for general adult medical examination without abnormal findings: Secondary | ICD-10-CM | POA: Insufficient documentation

## 2011-10-14 DIAGNOSIS — I447 Left bundle-branch block, unspecified: Secondary | ICD-10-CM

## 2011-10-14 DIAGNOSIS — I25810 Atherosclerosis of coronary artery bypass graft(s) without angina pectoris: Secondary | ICD-10-CM

## 2011-10-14 DIAGNOSIS — I10 Essential (primary) hypertension: Secondary | ICD-10-CM

## 2011-10-14 DIAGNOSIS — R7309 Other abnormal glucose: Secondary | ICD-10-CM

## 2011-10-14 DIAGNOSIS — I2510 Atherosclerotic heart disease of native coronary artery without angina pectoris: Secondary | ICD-10-CM | POA: Insufficient documentation

## 2011-10-14 DIAGNOSIS — Z23 Encounter for immunization: Secondary | ICD-10-CM

## 2011-10-14 LAB — PSA: PSA: 1.4 ng/mL (ref 0.10–4.00)

## 2011-10-14 LAB — HEMOGLOBIN A1C: Hgb A1c MFr Bld: 6.5 % (ref 4.6–6.5)

## 2011-10-14 MED ORDER — TECHNETIUM TC 99M TETROFOSMIN IV KIT
33.0000 | PACK | Freq: Once | INTRAVENOUS | Status: AC | PRN
  Administered 2011-10-14: 33 via INTRAVENOUS

## 2011-10-14 MED ORDER — ADENOSINE (DIAGNOSTIC) 3 MG/ML IV SOLN
0.5600 mg/kg | Freq: Once | INTRAVENOUS | Status: AC
  Administered 2011-10-14: 47.4 mg via INTRAVENOUS

## 2011-10-14 MED ORDER — TECHNETIUM TC 99M TETROFOSMIN IV KIT
11.0000 | PACK | Freq: Once | INTRAVENOUS | Status: AC | PRN
  Administered 2011-10-14: 11 via INTRAVENOUS

## 2011-10-14 NOTE — Progress Notes (Signed)
[facility] SITE 3 NUCLEAR MED [address] [place] [address] [phone]  Cardiology Nuclear Med Study  Jordan Carter is a 65 y.o. male [id number] 1946-06-28   Nuclear Med Background Indication for Stress Test:  Evaluation for Ischemia, Graft Patency and Assess LVF: Recent Echo EF 20%, if no improvement consider ICD History:  '07 CABG with MVR; 8/12 Echo:EF=20%, mild LVH Cardiac Risk Factors: Family History - CAD, History of Smoking, Hypertension, LBBB and Lipids  Symptoms:  No cardiac complaints   Nuclear Pre-Procedure Caffeine/Decaff Intake:  None NPO After: 7:00pm   Lungs:  Clear.  O2 SAT 98% on RA. IV 0.9% NS with Angio Cath:  20g  IV Site: R Antecubital x 1, tolerated well  IV Started by:  Jordan Hong, RN  Chest Size (in):  42 Cup Size: n/a  Height: 5\' 10"  (1.778 m)  Weight:  186 lb (84.369 kg)  BMI:  Body mass index is 26.69 kg/(m^2). Tech Comments:  Carvedilol held this am    Nuclear Med Study 1 or 2 day study: 1 day  Stress Test Type:  Adenosine  Reading MD: Jordan Ancona, MD  Order Authorizing Provider:  Hillis Range, MD, Jordan Carter, [facility]  Resting Radionuclide: Technetium 3m Tetrofosmin  Resting Radionuclide Dose: 11.0 mCi   Stress Radionuclide:  Technetium 36m Tetrofosmin  Stress Radionuclide Dose: 33.0 mCi           Stress Protocol Rest HR: 61 Stress HR: 63  Rest BP: 116/83 Stress BP: 127/100  Exercise Time (min): n/a METS: n/a   Predicted Max HR: 156 bpm % Max HR: 40.38 bpm Rate Pressure Product: 8001   Dose of Adenosine (mg):  47.3 Dose of Lexiscan: n/a mg  Dose of Atropine (mg): n/a Dose of Dobutamine: n/a mcg/kg/min (at max HR)  Stress Test Technologist: Jordan Carter, CMA-N  Nuclear Technologist:  Jordan Carter, CNMT     Rest Procedure:  Myocardial perfusion imaging was performed at rest 45 minutes following the intravenous administration of Technetium 12m Tetrofosmin.  Rest ECG: RAD, LBBB  Stress Procedure:  The  patient received IV adenosine at 140 mcg/kg/min for 4 minutes.  There were no significant changes with infusion.  The patient had no symptoms with infusion.  Technetium 34m Tetrofosmin was injected at the 2 minute mark and quantitative spect images were obtained after a 45 minute delay.  Stress ECG: No significant change from baseline ECG  QPS Raw Data Images:  Normal; no motion artifact; normal heart/lung ratio. Stress Images:  Moderate basal to mid inferior perfusion defect and small apical perfusion defect.  Rest Images:  Moderate basal to mid inferior perfusion defect and small apical perfusion defect.  Subtraction (SDS):  Fixed basal to mid inferior perfusion defect and apical perfusion defect.  Transient Ischemic Dilatation (Normal <1.22):  0.98 Lung/Heart Ratio (Normal <0.45):  0.29  Quantitative Gated Spect Images QGS EDV:  290 ml QGS ESV:  228 ml QGS cine images:  Global hypokinesis with paradoxical septal motion consistent with LBBB.  QGS EF: 22%  Impression Exercise Capacity:  Adenosine study with no exercise. BP Response:  Normal blood pressure response. Clinical Symptoms:  No symptoms.  ECG Impression:  Baseline:  LBBB.  EKG uninterpretable due to LBBB at rest and stress. Comparison with Prior Nuclear Study: No previous nuclear study performed  Overall Impression:  Severe LV systolic function, EF 22%, with global hypokinesis and paradoxical septal motion consistent with LBBB.  There was a moderate fixed basal to mid inferior  perfusion defect and a small fixed apical perfusion defect.  These findings are suggestive of infarction without significant ischemia.   Jordan Carter [employer]

## 2011-10-14 NOTE — Progress Notes (Signed)
Subjective:    Patient ID: Jordan Carter, male    DOB: 1946-09-18, 65 y.o.   MRN: [id number]  HPI New to me for a complete physical. He was admitted about 2 months ago for CHF but is doing well recently and he offers no complaints today.  Review of Systems  Constitutional: Negative for fever, chills, diaphoresis, activity change, appetite change, fatigue and unexpected weight change.  HENT: Negative.   Eyes: Negative.   Respiratory: Negative for apnea, cough, choking, chest tightness, shortness of breath, wheezing and stridor.   Cardiovascular: Negative for chest pain, palpitations and leg swelling.  Genitourinary: Negative for dysuria, urgency, frequency, hematuria, flank pain, decreased urine volume, enuresis, genital sores and testicular pain.  Musculoskeletal: Negative.   Skin: Negative.   Neurological: Negative.   Hematological: Negative for adenopathy. Does not bruise/bleed easily.  Psychiatric/Behavioral: Negative.        Objective:   Physical Exam  Vitals reviewed. Constitutional: He is oriented to person, place, and time. He appears well-developed and well-nourished. No distress.  HENT:  Head: Normocephalic and atraumatic.  Nose: Nose normal.  Mouth/Throat: Oropharynx is clear and moist. No oropharyngeal exudate.  Eyes: Conjunctivae and EOM are normal. Pupils are equal, round, and reactive to light. Right eye exhibits no discharge. Left eye exhibits no discharge. No scleral icterus.  Neck: Normal range of motion. Neck supple. No JVD present. No tracheal deviation present. No thyromegaly present.  Cardiovascular: Normal rate, regular rhythm, normal heart sounds and intact distal pulses.  Exam reveals no gallop and no friction rub.   No murmur heard. Pulmonary/Chest: Effort normal and breath sounds normal. No stridor. No respiratory distress. He has no wheezes. He has no rales. He exhibits no tenderness.  Abdominal: Soft. Bowel sounds are normal. He exhibits no distension and  no mass. There is no tenderness. There is no rebound and no guarding. Hernia confirmed negative in the right inguinal area and confirmed negative in the left inguinal area.  Genitourinary: Testes normal and penis normal. Guaiac negative stool. Right testis shows no mass, no swelling and no tenderness. Right testis is descended. Left testis shows no mass, no swelling and no tenderness. Left testis is descended. Uncircumcised. No phimosis, paraphimosis, hypospadias, penile erythema or penile tenderness. No discharge found.  Musculoskeletal: Normal range of motion. He exhibits no edema and no tenderness.  Lymphadenopathy:    He has no cervical adenopathy.       Right: No inguinal adenopathy present.       Left: No inguinal adenopathy present.  Neurological: He is alert and oriented to person, place, and time. He has normal reflexes. He displays normal reflexes. He exhibits normal muscle tone.  Skin: Skin is warm and dry. No rash noted. He is not diaphoretic. No erythema. No pallor.  Psychiatric: He has a normal mood and affect. His behavior is normal. Judgment and thought content normal.      Lab Results  Component Value Date   WBC 6.3 08/11/2011   HGB 13.6 08/11/2011   HCT 41.7 08/11/2011   PLT 210 08/11/2011   GLUCOSE 118* 09/27/2011   CHOL 168 08/10/2011   TRIG 104 08/10/2011   HDL 39* 08/10/2011   LDLCALC 108* 08/10/2011   ALT 34 08/09/2011   AST 33 08/09/2011   NA 140 09/27/2011   K 4.7 09/27/2011   CL 103 09/27/2011   CREATININE 0.9 09/27/2011   BUN 28* 09/27/2011   CO2 30 09/27/2011   TSH 3.166 08/10/2011   INR 1.17 08/09/2011  HGBA1C 6.4* 08/10/2011      Assessment & Plan:

## 2011-10-14 NOTE — Assessment & Plan Note (Signed)
Exam done, labs ordered and others reviewed, vaccines were updated, I have asked him to schedule a colonoscopy

## 2011-10-14 NOTE — Patient Instructions (Signed)
Health Maintenance, Males A healthy lifestyle and preventative care can promote health and wellness.  Maintain regular health, dental, and eye exams.   Eat a healthy diet. Foods like vegetables, fruits, whole grains, low-fat dairy products, and lean protein foods contain the nutrients you need without too many calories. Decrease your intake of foods high in solid fats, added sugars, and salt. Get information about a proper diet from your caregiver, if necessary.   Regular physical exercise is one of the most important things you can do for your health. Most adults should get at least 150 minutes of moderate-intensity exercise (any activity that increases your heart rate and causes you to sweat) each week. In addition, most adults need muscle-strengthening exercises on 2 or more days a week.    Maintain a healthy weight. The body mass index (BMI) is a screening tool to identify possible weight problems. It provides an estimate of body fat based on height and weight. Your caregiver can help determine your BMI, and can help you achieve or maintain a healthy weight. For adults 20 years and older:   A BMI below 18.5 is considered underweight.   A BMI of 18.5 to 24.9 is normal.   A BMI of 25 to 29.9 is considered overweight.   A BMI of 30 and above is considered obese.   Maintain normal blood lipids and cholesterol by exercising and minimizing your intake of saturated fat. Eat a balanced diet with plenty of fruits and vegetables. Blood tests for lipids and cholesterol should begin at age 20 and be repeated every 5 years. If your lipid or cholesterol levels are high, you are over 50, or you are a high risk for heart disease, you may need your cholesterol levels checked more frequently.Ongoing high lipid and cholesterol levels should be treated with medicines, if diet and exercise are not effective.   If you smoke, find out from your caregiver how to quit. If you do not use tobacco, do not start.    If you choose to drink alcohol, do not exceed 2 drinks per day. One drink is considered to be 12 ounces (355 mL) of beer, 5 ounces (148 mL) of wine, or 1.5 ounces (44 mL) of liquor.   Avoid use of street drugs. Do not share needles with anyone. Ask for help if you need support or instructions about stopping the use of drugs.   High blood pressure causes heart disease and increases the risk of stroke. Blood pressure should be checked at least every 1 to 2 years. Ongoing high blood pressure should be treated with medicines if weight loss and exercise are not effective.   If you are 45 to 65 years old, ask your caregiver if you should take aspirin to prevent heart disease.   Diabetes screening involves taking a blood sample to check your fasting blood sugar level. This should be done once every 3 years, after age 45, if you are within normal weight and without risk factors for diabetes. Testing should be considered at a younger age or be carried out more frequently if you are overweight and have at least 1 risk factor for diabetes.   Colorectal cancer can be detected and often prevented. Most routine colorectal cancer screening begins at the age of 50 and continues through age 75. However, your caregiver may recommend screening at an earlier age if you have risk factors for colon cancer. On a yearly basis, your caregiver may provide home test kits to check for hidden   blood in the stool. Use of a small camera at the end of a tube, to directly examine the colon (sigmoidoscopy or colonoscopy), can detect the earliest forms of colorectal cancer. Talk to your caregiver about this at age 50, when routine screening begins. Direct examination of the colon should be repeated every 5 to 10 years through age 75, unless early forms of pre-cancerous polyps or small growths are found.   Healthy men should no longer receive prostate-specific antigen (PSA) blood tests as part of routine cancer screening. Consult with  your caregiver about prostate cancer screening.   Practice safe sex. Use condoms and avoid high-risk sexual practices to reduce the spread of sexually transmitted infections (STIs).   Use sunscreen with a sun protection factor (SPF) of 30 or greater. Apply sunscreen liberally and repeatedly throughout the day. You should seek shade when your shadow is shorter than you. Protect yourself by wearing long sleeves, pants, a wide-brimmed hat, and sunglasses year round, whenever you are outdoors.   Notify your caregiver of new moles or changes in moles, especially if there is a change in shape or color. Also notify your caregiver if a mole is larger than the size of a pencil eraser.   A one-time screening for abdominal aortic aneurysm (AAA) and surgical repair of large AAAs by sound wave imaging (ultrasonography) is recommended for ages 65 to 75 years who are current or former smokers.   Stay current with your immunizations.  Document Released: 05/30/2008 Document Revised: 08/14/2011 Document Reviewed: 04/29/2011 [employer] Patient Information 2012 [employer]. 

## 2011-10-14 NOTE — Assessment & Plan Note (Addendum)
i will recheck his a1c today but I don't think he needs to start meds yet

## 2011-10-14 NOTE — Assessment & Plan Note (Signed)
His BP is well controlled 

## 2011-10-15 ENCOUNTER — Telehealth: Payer: Self-pay | Admitting: Physician Assistant

## 2011-10-15 ENCOUNTER — Ambulatory Visit: Payer: 59 | Admitting: Physician Assistant

## 2011-10-15 NOTE — Telephone Encounter (Signed)
Nancy was notified

## 2011-10-15 NOTE — Telephone Encounter (Signed)
NA. No voicemail.  

## 2011-10-15 NOTE — Telephone Encounter (Signed)
BMET can be repeated periodically or when medication changes are made. Make sure OV is rescheduled. Tereso Newcomer, PA-C

## 2011-10-15 NOTE — Telephone Encounter (Signed)
Pt calling for results.  Results of BMET given to pt's poa.  She wants to know when they should be repeated?

## 2011-10-15 NOTE — Telephone Encounter (Signed)
Pt POA calling stating that pt didn't come to appt b/c pt thought appt was to discuss the results of pt blood work and pt wanted to discuss those results over the phone as oppose to coming in the office.   Please return pt POA call to inform of results of blood work.

## 2011-10-16 ENCOUNTER — Ambulatory Visit: Payer: 59 | Admitting: Physician Assistant

## 2011-11-13 ENCOUNTER — Encounter: Payer: Self-pay | Admitting: Internal Medicine

## 2011-11-13 ENCOUNTER — Ambulatory Visit (INDEPENDENT_AMBULATORY_CARE_PROVIDER_SITE_OTHER): Payer: 59 | Admitting: Internal Medicine

## 2011-11-13 DIAGNOSIS — E7800 Pure hypercholesterolemia, unspecified: Secondary | ICD-10-CM

## 2011-11-13 DIAGNOSIS — I5022 Chronic systolic (congestive) heart failure: Secondary | ICD-10-CM

## 2011-11-13 DIAGNOSIS — I2589 Other forms of chronic ischemic heart disease: Secondary | ICD-10-CM

## 2011-11-13 DIAGNOSIS — E785 Hyperlipidemia, unspecified: Secondary | ICD-10-CM

## 2011-11-13 DIAGNOSIS — I255 Ischemic cardiomyopathy: Secondary | ICD-10-CM

## 2011-11-13 DIAGNOSIS — I10 Essential (primary) hypertension: Secondary | ICD-10-CM

## 2011-11-13 MED ORDER — LOSARTAN POTASSIUM 100 MG PO TABS: 100.0000 mg | ORAL_TABLET | Freq: Every day | ORAL | Status: DC

## 2011-11-13 NOTE — Assessment & Plan Note (Signed)
Above goal Increase cozaar Compliance encouraged

## 2011-11-13 NOTE — Progress Notes (Signed)
PCP:  MATTHEWS,CODY, DO  The patient presents today for routine electrophysiology followup.  Since last being seen in our clinic, the patient reports doing very well.  He remains quite active.  Today, he denies symptoms of palpitations, chest pain, shortness of breath, orthopnea, PND, lower extremity edema, dizziness, presyncope, syncope, or neurologic sequela.  The patient feels that he is tolerating medications without difficulties and is otherwise without complaint today.   Past Medical History  Diagnosis Date  . Chronic systolic heart failure     Ischemic CM; Echo 8/12: mild LVH, EF 20%, mild MR, mild LAE, mod RAE, mod RVE, mod reduced RVSF, PASP 44;  admx to hosp 07/2011  . CAD (coronary artery disease)     s/p CABG in [place]  . LBBB (left bundle branch block)   . Hypertension   . Ischemic cardiomyopathy   . Mitral valve disease     s/p MV repair 2007 in [place]  . Medical non-compliance   . Glucose intolerance (impaired glucose tolerance)   . HLD (hyperlipidemia)    Past Surgical History  Procedure Date  . Coronary artery bypass graft 2005  . Appendectomy   . Mitral valve repair     Current Outpatient Prescriptions  Medication Sig Dispense Refill  . aspirin 325 MG EC tablet Take 325 mg by mouth daily.        . carvedilol (COREG) 6.25 MG tablet Take 1 tablet (6.25 mg total) by mouth 2 (two) times daily with a meal.  180 tablet  3  . furosemide (LASIX) 40 MG tablet Take 1 tablet (40 mg total) by mouth daily.      Marland Kitchen losartan (COZAAR) 100 MG tablet Take 1 tablet (100 mg total) by mouth daily.  90 tablet  3  . Multiple Vitamin (MULTIVITAMIN) tablet Take 1 tablet by mouth daily.        . simvastatin (ZOCOR) 10 MG tablet Take 1 tablet (10 mg total) by mouth at bedtime.  90 tablet  3    No Known Allergies  History   Social History  . Marital Status: Married    Spouse Name: N/A    Number of Children: N/A  . Years of Education: N/A   Occupational History  . Not on  file.   Social History Main Topics  . Smoking status: Former Games developer  . Smokeless tobacco: Not on file  . Alcohol Use: Yes     RARELY  . Drug Use: No  . Sexually Active: Yes   Other Topics Concern  . Not on file   Social History Narrative     The patient lives partially in [place] and partially  in [place].  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  He denies tobacco or drug use and rarely drinks alcohol.     Family History  Problem Relation Age of Onset  . Hyperlipidemia Father   . Hypertension Father   . Heart disease Father   . Cancer Sister     Physical Exam: Filed Vitals:   11/13/11 0912  BP: 142/86  Pulse: 70  Resp: 20  Height: 5\' 9"  (1.753 m)  Weight: 195 lb 1.9 oz (88.506 kg)    GEN- The patient is well appearing, alert and oriented x 3 today.   Head- normocephalic, atraumatic Eyes-  Sclera clear, conjunctiva pink Ears- hearing intact Oropharynx- clear Neck- supple, no JVP Lymph- no cervical lymphadenopathy Lungs- Clear to ausculation bilaterally, normal work of breathing Heart- Regular rate and  rhythm, no murmurs, rubs or gallops, PMI not laterally displaced GI- soft, NT, ND, + BS Extremities- no clubbing, cyanosis, or edema MS- no significant deformity or atrophy Skin- no rash or lesion Psych- euthymic mood, full affect Neuro- strength and sensation are intact  lexiscan from 10/12 reviewed with pt and spouse today  Assessment and Plan:

## 2011-11-13 NOTE — Assessment & Plan Note (Signed)
No ischemic symptoms As above 

## 2011-11-13 NOTE — Assessment & Plan Note (Signed)
Fasting lipids and LFTs in 3 months

## 2011-11-13 NOTE — Assessment & Plan Note (Signed)
euvolemic today Increase cozaar to 100mg  daily Return in 3 months for echo.  If EF <35%, will plan discussion of BiV ICD at that time

## 2011-11-13 NOTE — Patient Instructions (Addendum)
Your physician recommends that you schedule a follow-up appointment in: 3 months with Dr Johney Frame  Your physician has requested that you have an echocardiogram. Echocardiography is a painless test that uses sound waves to create images of your heart. It provides your doctor with information about the size and shape of your heart and how well your heart's chambers and valves are working. This procedure takes approximately one hour. There are no restrictions for this procedure.--one week prior to follow up appointment  Your physician has recommended you make the following change in your medication:  1)Increase Cozaar to 100mg  daily(take 2 of the 50mg  tablets until you run out)  Your physician recommends that you return for lab work --same day as echo prior to follow up appointment  Fasting Lipid/Liver/BMP

## 2011-12-24 ENCOUNTER — Encounter: Payer: Self-pay | Admitting: Gastroenterology

## 2012-01-27 ENCOUNTER — Telehealth: Payer: Self-pay | Admitting: Internal Medicine

## 2012-01-27 NOTE — Telephone Encounter (Signed)
New Problem     Per Harriett Sine [phone]  Patient MPOA, patient refuses to come to echo Patient MPOA patient refuses to come to appnt, canceled 01/31/12 Echo.

## 2012-01-27 NOTE — Telephone Encounter (Signed)
Above noted.  Echo cancelled.

## 2012-01-31 ENCOUNTER — Other Ambulatory Visit: Payer: 59 | Admitting: *Deleted

## 2012-01-31 ENCOUNTER — Other Ambulatory Visit (HOSPITAL_COMMUNITY): Payer: 59 | Admitting: Radiology

## 2012-02-03 ENCOUNTER — Ambulatory Visit: Payer: 59 | Admitting: Internal Medicine

## 2012-02-05 ENCOUNTER — Encounter: Payer: Self-pay | Admitting: Internal Medicine

## 2012-03-16 NOTE — H&P (Signed)
Signed late 

## 2012-03-19 ENCOUNTER — Ambulatory Visit (INDEPENDENT_AMBULATORY_CARE_PROVIDER_SITE_OTHER)
Admission: RE | Admit: 2012-03-19 | Discharge: 2012-03-19 | Disposition: A | Discharge status: Home / Self Care | Payer: 59 | Source: Ambulatory Visit | Attending: Internal Medicine | Admitting: Internal Medicine

## 2012-03-19 ENCOUNTER — Encounter: Payer: Self-pay | Admitting: Internal Medicine

## 2012-03-19 ENCOUNTER — Ambulatory Visit (INDEPENDENT_AMBULATORY_CARE_PROVIDER_SITE_OTHER): Payer: 59 | Admitting: Internal Medicine

## 2012-03-19 VITALS — BP 114/64 | HR 94 | Temp 101.1°F | Resp 16 | Ht 65.0 in | Wt 203.4 lb

## 2012-03-19 DIAGNOSIS — J209 Acute bronchitis, unspecified: Secondary | ICD-10-CM

## 2012-03-19 DIAGNOSIS — R05 Cough: Secondary | ICD-10-CM

## 2012-03-19 DIAGNOSIS — J45909 Unspecified asthma, uncomplicated: Secondary | ICD-10-CM

## 2012-03-19 DIAGNOSIS — J189 Pneumonia, unspecified organism: Secondary | ICD-10-CM | POA: Insufficient documentation

## 2012-03-19 MED ORDER — GUAIFENESIN-CODEINE 100-10 MG/5ML PO SYRP: 5.0000 mL | ORAL_SOLUTION | Freq: Three times a day (TID) | ORAL | Status: AC | PRN

## 2012-03-19 MED ORDER — MOXIFLOXACIN HCL 400 MG PO TABS: 400.0000 mg | ORAL_TABLET | Freq: Every day | ORAL | Status: AC

## 2012-03-19 MED ORDER — ALBUTEROL SULFATE HFA 108 (90 BASE) MCG/ACT IN AERS: 2.0000 | INHALATION_SPRAY | Freq: Four times a day (QID) | RESPIRATORY_TRACT | Status: DC | PRN

## 2012-03-19 MED ORDER — METHYLPREDNISOLONE ACETATE 80 MG/ML IJ SUSP
120.0000 mg | Freq: Once | INTRAMUSCULAR | Status: AC
  Administered 2012-03-19: 120 mg via INTRAMUSCULAR

## 2012-03-19 NOTE — Patient Instructions (Signed)
Asthma, Adult Asthma is caused by narrowing of the air passages in the lungs. It may be triggered by pollen, dust, animal dander, molds, some foods, respiratory infections, exposure to smoke, exercise, emotional stress or other allergens (things that cause allergic reactions or allergies). Repeat attacks are common. HOME CARE INSTRUCTIONS   Use prescription medications as ordered by your caregiver.   Avoid pollen, dust, animal dander, molds, smoke and other things that cause attacks at home and at work.   You may have fewer attacks if you decrease dust in your home. Electrostatic air cleaners may help.   It may help to replace your pillows or mattress with materials less likely to cause allergies.   Talk to your caregiver about an action plan for managing asthma attacks at home, including, the use of a peak flow meter which measures the severity of your asthma attack. An action plan can help minimize or stop the attack without having to seek medical care.   If you are not on a fluid restriction, drink 8 to 10 glasses of water each day.   Always have a plan prepared for seeking medical attention, including, calling your physician, accessing local emergency care, and calling 911 (in the [place] for a severe attack.   Discuss possible exercise routines with your caregiver.   If animal dander is the cause of asthma, you may need to get rid of pets.  SEEK MEDICAL CARE IF:   You have wheezing and shortness of breath even if taking medicine to prevent attacks.   You have muscle aches, chest pain or thickening of sputum.   Your sputum changes from clear or white to yellow, green, gray, or bloody.   You have any problems that may be related to the medicine you are taking (such as a rash, itching, swelling or trouble breathing).  SEEK IMMEDIATE MEDICAL CARE IF:   Your usual medicines do not stop your wheezing or there is increased coughing and/or shortness of breath.   You have increased  difficulty breathing.   You have a fever.  MAKE SURE YOU:   Understand these instructions.   Will watch your condition.   Will get help right away if you are not doing well or get worse.  Document Released: 12/02/2005 Document Revised: 11/21/2011 Document Reviewed: 07/20/2008 Exit[employer] Patient Information 2012 [employer].Acute Bronchitis You have acute bronchitis. This means you have a chest cold. The airways in your lungs are red and sore (inflamed). Acute means it is sudden onset.  CAUSES Bronchitis is most often caused by the same virus that causes a cold. SYMPTOMS   Body aches.   Chest congestion.   Chills.   Cough.   Fever.   Shortness of breath.   Sore throat.  TREATMENT  Acute bronchitis is usually treated with rest, fluids, and medicines for relief of fever or cough. Most symptoms should go away after a few days or a week. Increased fluids may help thin your secretions and will prevent dehydration. Your caregiver may give you an inhaler to improve your symptoms. The inhaler reduces shortness of breath and helps control cough. You can take over-the-counter pain relievers or cough medicine to decrease coughing, pain, or fever. A cool-air vaporizer may help thin bronchial secretions and make it easier to clear your chest. Antibiotics are usually not needed but can be prescribed if you smoke, are seriously ill, have chronic lung problems, are elderly, or you are at higher risk for developing complications.Allergies and asthma can make bronchitis worse.   Repeated episodes of bronchitis may cause longstanding lung problems. Avoid smoking and secondhand smoke.Exposure to cigarette smoke or irritating chemicals will make bronchitis worse. If you are a cigarette smoker, consider using nicotine gum or skin patches to help control withdrawal symptoms. Quitting smoking will help your lungs heal faster. Recovery from bronchitis is often slow, but you should start feeling better  after 2 to 3 days. Cough from bronchitis frequently lasts for 3 to 4 weeks. To prevent another bout of acute bronchitis:  Quit smoking.   Wash your hands frequently to get rid of viruses or use a hand sanitizer.   Avoid other people with cold or virus symptoms.   Try not to touch your hands to your mouth, nose, or eyes.  SEEK IMMEDIATE MEDICAL CARE IF:  You develop increased fever, chills, or chest pain.   You have severe shortness of breath or bloody sputum.   You develop dehydration, fainting, repeated vomiting, or a severe headache.   You have no improvement after 1 week of treatment or you get worse.  MAKE SURE YOU:   Understand these instructions.   Will watch your condition.   Will get help right away if you are not doing well or get worse.  Document Released: 01/09/2005 Document Revised: 11/21/2011 Document Reviewed: 03/27/2011 Exit[employer] Patient Information 2012 [employer]. 

## 2012-03-19 NOTE — Assessment & Plan Note (Signed)
He was given a dose of depo-medrol IM tor reduce the inflammation and he will continue using the albuterol inhaler

## 2012-03-19 NOTE — Assessment & Plan Note (Signed)
I will check his CXR to look for PNA, mass, edema ,etc

## 2012-03-19 NOTE — Assessment & Plan Note (Signed)
Start avelox for the infection and a cough suppressant 

## 2012-03-19 NOTE — Progress Notes (Signed)
Subjective:    Patient ID: Jordan Carter, male    DOB: 01-15-1946, 66 y.o.   MRN: [id number]  Cough This is a new problem. The current episode started in the past 7 days. The problem has been gradually worsening. The cough is productive of purulent sputum. Associated symptoms include chills, a fever, a sore throat, shortness of breath, sweats and wheezing. Pertinent negatives include no chest pain, ear congestion, ear pain, headaches, heartburn, hemoptysis, myalgias, nasal congestion, postnasal drip, rash, rhinorrhea or weight loss. The symptoms are aggravated by cold air. Risk factors for lung disease include travel. He has tried nothing for the symptoms. His past medical history is significant for bronchitis.      Review of Systems  Constitutional: Positive for fever and chills. Negative for weight loss, diaphoresis, activity change, appetite change, fatigue and unexpected weight change.  HENT: Positive for sore throat. Negative for ear pain, rhinorrhea and postnasal drip.   Eyes: Negative.   Respiratory: Positive for cough, shortness of breath and wheezing. Negative for apnea, hemoptysis, choking, chest tightness and stridor.   Cardiovascular: Negative for chest pain, palpitations and leg swelling.  Gastrointestinal: Negative for heartburn, nausea, vomiting, abdominal pain, diarrhea, constipation and anal bleeding.  Genitourinary: Negative.   Musculoskeletal: Negative for myalgias, back pain, joint swelling, arthralgias and gait problem.  Skin: Negative for color change, pallor, rash and wound.  Neurological: Negative for dizziness, tremors, seizures, syncope, facial asymmetry, speech difficulty, weakness, light-headedness, numbness and headaches.  Hematological: Negative for adenopathy. Does not bruise/bleed easily.  Psychiatric/Behavioral: Negative.        Objective:   Physical Exam  Vitals reviewed. Constitutional: He is oriented to person, place, and time. He appears well-developed  and well-nourished. No distress.  HENT:  Head: Normocephalic and atraumatic.  Mouth/Throat: Oropharynx is clear and moist. No oropharyngeal exudate.  Eyes: Conjunctivae are normal. Right eye exhibits no discharge. Left eye exhibits no discharge. No scleral icterus.  Neck: Normal range of motion. Neck supple. No JVD present. No tracheal deviation present. No thyromegaly present.  Cardiovascular: Normal rate, regular rhythm, normal heart sounds and intact distal pulses.  Exam reveals no gallop and no friction rub.   No murmur heard. Pulmonary/Chest: Effort normal. No accessory muscle usage or stridor. Not tachypneic. No respiratory distress. He has no decreased breath sounds. He has wheezes. He has rhonchi. He has no rales. He exhibits no tenderness.       He received a jet neb with 2.5 mg of albuterol and afterward his wheezing has cleared and he has a few scattered rhonchi with good air movement  Abdominal: Soft. Bowel sounds are normal. He exhibits no distension and no mass. There is no tenderness. There is no rebound and no guarding.  Musculoskeletal: Normal range of motion. He exhibits no edema and no tenderness.  Lymphadenopathy:    He has no cervical adenopathy.  Neurological: He is oriented to person, place, and time.  Skin: Skin is warm and dry. No rash noted. He is not diaphoretic. No erythema. No pallor.  Psychiatric: He has a normal mood and affect. His behavior is normal. Judgment and thought content normal.     Lab Results  Component Value Date   WBC 6.3 08/11/2011   HGB 13.6 08/11/2011   HCT 41.7 08/11/2011   PLT 210 08/11/2011   GLUCOSE 118* 09/27/2011   CHOL 168 08/10/2011   TRIG 104 08/10/2011   HDL 39* 08/10/2011   LDLCALC 108* 08/10/2011   ALT 34 08/09/2011   AST  33 08/09/2011   NA 140 09/27/2011   K 4.7 09/27/2011   CL 103 09/27/2011   CREATININE 0.9 09/27/2011   BUN 28* 09/27/2011   CO2 30 09/27/2011   TSH 3.166 08/10/2011   PSA 1.40 10/14/2011   INR 1.17 08/09/2011     HGBA1C 6.5 10/14/2011       Assessment & Plan:

## 2012-03-23 ENCOUNTER — Telehealth: Payer: Self-pay

## 2012-03-23 NOTE — Telephone Encounter (Signed)
Jordan Carter

## 2012-03-23 NOTE — Telephone Encounter (Signed)
1) yes 2) yes 3) just give it some and time and come in this week for a repeat cxr

## 2012-03-23 NOTE — Telephone Encounter (Signed)
POA notified.

## 2012-03-23 NOTE — Telephone Encounter (Signed)
Patient POA called stating that she LM on MD VM this am asking for a personal call back. 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 She is very concern and would like for MD to call her back at [phone]

## 2012-03-24 ENCOUNTER — Ambulatory Visit: Payer: 59 | Admitting: Internal Medicine

## 2012-03-26 ENCOUNTER — Ambulatory Visit (INDEPENDENT_AMBULATORY_CARE_PROVIDER_SITE_OTHER)
Admission: RE | Admit: 2012-03-26 | Discharge: 2012-03-26 | Disposition: A | Discharge status: Home / Self Care | Payer: 59 | Source: Ambulatory Visit | Attending: Internal Medicine | Admitting: Internal Medicine

## 2012-03-26 ENCOUNTER — Ambulatory Visit (INDEPENDENT_AMBULATORY_CARE_PROVIDER_SITE_OTHER): Payer: 59 | Admitting: Internal Medicine

## 2012-03-26 ENCOUNTER — Encounter: Payer: Self-pay | Admitting: Internal Medicine

## 2012-03-26 VITALS — BP 128/70 | HR 61 | Temp 97.8°F | Resp 16 | Wt 192.0 lb

## 2012-03-26 DIAGNOSIS — R05 Cough: Secondary | ICD-10-CM

## 2012-03-26 DIAGNOSIS — J189 Pneumonia, unspecified organism: Secondary | ICD-10-CM

## 2012-03-26 NOTE — Progress Notes (Signed)
  Subjective:    Patient ID: Jordan Carter, male    DOB: Aug 13, 1946, 66 y.o.   MRN: [id number]  Pneumonia He complains of cough. There is no chest tightness, difficulty breathing, frequent throat clearing, hemoptysis, hoarse voice, shortness of breath, sputum production or wheezing. This is a new problem. The current episode started in the past 7 days. The problem occurs rarely. The problem has been gradually improving. The cough is non-productive. Pertinent negatives include no appetite change, chest pain, dyspnea on exertion, ear congestion, ear pain, fever, headaches, heartburn, malaise/fatigue, myalgias, nasal congestion, orthopnea, PND, postnasal drip, rhinorrhea, sneezing, sore throat, sweats, trouble swallowing or weight loss. His symptoms are alleviated by prescription cough suppressant. He reports significant improvement on treatment.      Review of Systems  Constitutional: Negative for fever, chills, weight loss, malaise/fatigue, diaphoresis, activity change, appetite change, fatigue and unexpected weight change.  HENT: Negative.  Negative for ear pain, sore throat, hoarse voice, rhinorrhea, sneezing, trouble swallowing and postnasal drip.   Eyes: Negative.   Respiratory: Positive for cough. Negative for apnea, hemoptysis, sputum production, choking, chest tightness, shortness of breath, wheezing and stridor.   Cardiovascular: Negative for chest pain, dyspnea on exertion and PND.  Gastrointestinal: Negative.  Negative for heartburn.  Genitourinary: Negative.   Musculoskeletal: Negative.  Negative for myalgias.  Skin: Negative.   Neurological: Negative.  Negative for headaches.  Hematological: Negative for adenopathy. Does not bruise/bleed easily.  Psychiatric/Behavioral: Negative.        Objective:   Physical Exam  Vitals reviewed. Constitutional: He appears well-developed and well-nourished. No distress.  HENT:  Head: Normocephalic and atraumatic.  Mouth/Throat: Oropharynx is  clear and moist. No oropharyngeal exudate.  Eyes: Conjunctivae are normal. Right eye exhibits no discharge. Left eye exhibits no discharge. No scleral icterus.  Neck: Normal range of motion. Neck supple. No JVD present. No tracheal deviation present. No thyromegaly present.  Cardiovascular: Normal rate, regular rhythm, normal heart sounds and intact distal pulses.  Exam reveals no gallop and no friction rub.   No murmur heard. Pulmonary/Chest: Effort normal and breath sounds normal. No stridor. No respiratory distress. He has no wheezes. He has no rales. He exhibits no tenderness.  Abdominal: Soft. Bowel sounds are normal. He exhibits no distension. There is no tenderness. There is no rebound and no guarding.  Musculoskeletal: Normal range of motion. He exhibits no edema and no tenderness.  Lymphadenopathy:    He has no cervical adenopathy.  Skin: Skin is warm and dry. No rash noted. He is not diaphoretic. No erythema. No pallor.  Psychiatric: He has a normal mood and affect. His behavior is normal. Judgment and thought content normal.      Dg Chest 2 View  03/19/2012  *RADIOLOGY REPORT*  Clinical Data: Cough  CHEST - 2 VIEW  Comparison: 08/11/2011  Findings: Cardiomegaly again noted.  Status post median sternotomy. There is patchy infiltrate in the right upper lobe suspicious for pneumonia.  Follow-up to resolution after appropriate treatment is recommended. Stable mild degenerative changes thoracic spine.  IMPRESSION: Patchy infiltrate in the right upper lobe suspicious for pneumonia. Follow-up to resolution after appropriate treatment is recommended.  Original Report Authenticated By: Natasha Mead, M.D.     Assessment & Plan:

## 2012-03-26 NOTE — Patient Instructions (Signed)

## 2012-03-26 NOTE — Assessment & Plan Note (Signed)
He has improved significantly on avelox, I will recheck his CXR today

## 2012-09-16 ENCOUNTER — Other Ambulatory Visit: Payer: Self-pay

## 2012-09-16 MED ORDER — CARVEDILOL 6.25 MG PO TABS: 6.2500 mg | ORAL_TABLET | Freq: Two times a day (BID) | ORAL | Status: DC

## 2012-10-02 IMAGING — CT CT ANGIO CHEST
2 of 6 series · 19 of 46 positions shown · IV contrast (APPLIED)
Comparison: 08/09/2011 radiograph

Enlarged cardiac silhouette with perihilar fullness and increased
interstitial markings. *RADIOLOGY REPORT*
CLINICAL DATA: Shortness of breath, chest pain.

CT ANGIOGRAPHY CHEST WITH CONTRAST
TECHNIQUE: Multidetector CT imaging of the chest was performed
using the standard protocol during bolus administration of
intravenous contrast.  Multiplanar CT image reconstructions
including MIPs were obtained to evaluate the vascular anatomy.
Contrast:  100 ml Imnipaque-O77 intravenous contrast

[Series 8: pulm embolism 1.0 b25f thin · axial · 0.77mm/px · z∈[-266,+26]mm · 16 of 319 slices shown]
[im 14/319  lung]
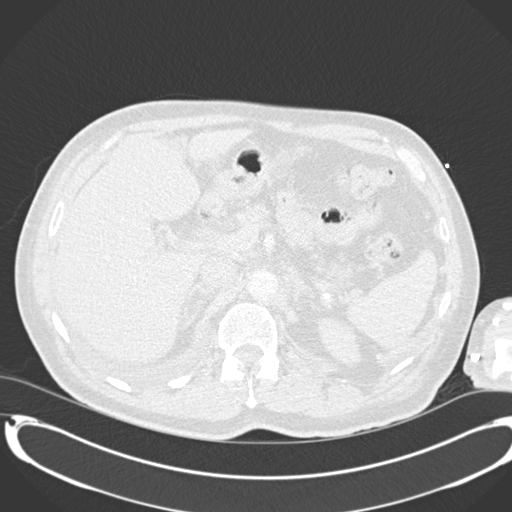
[im 42/319  soft-tissue]
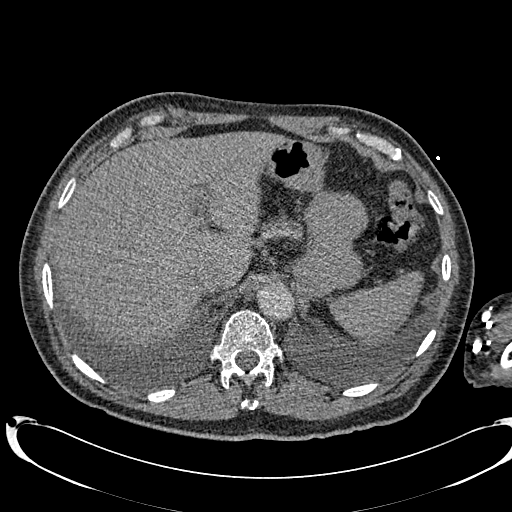
[im 56/319  lung]
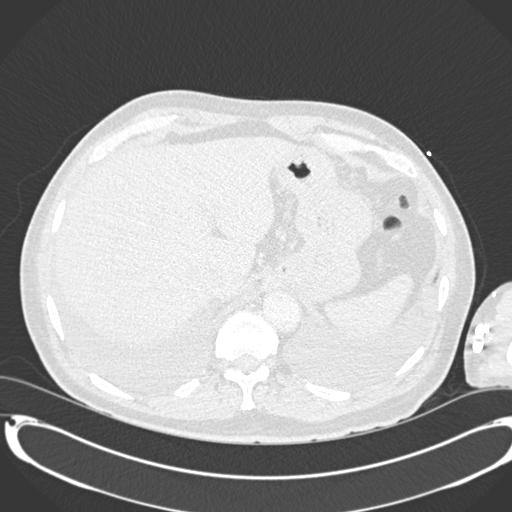
[im 70/319  soft-tissue]
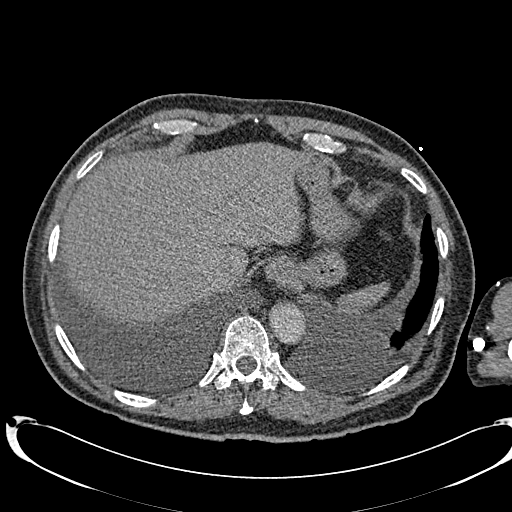
[im 97/319  lung]
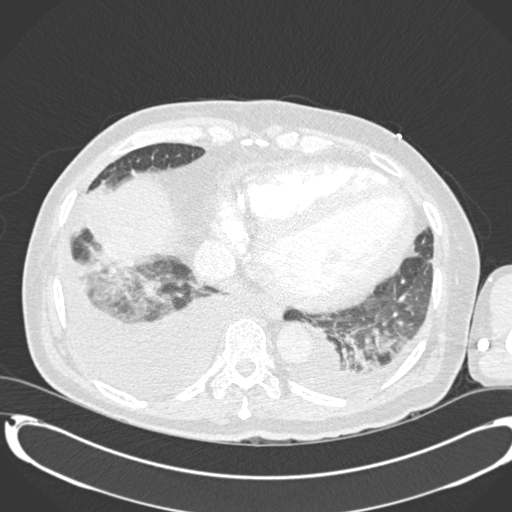
[im 111/319  soft-tissue]
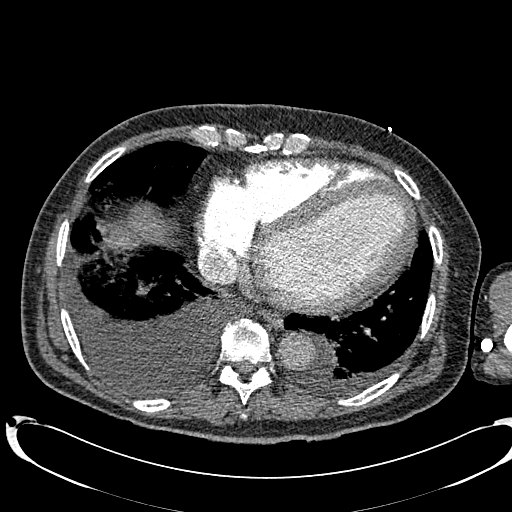
[im 125/319  lung]
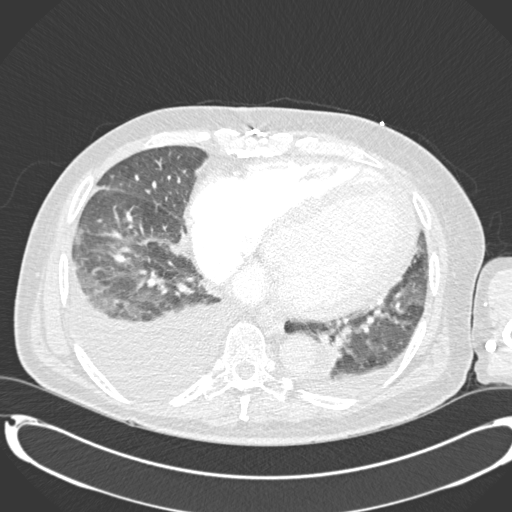
[im 153/319  soft-tissue]
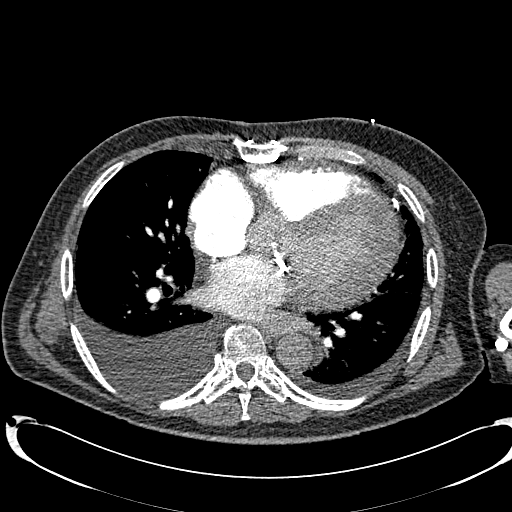
[im 166/319  lung]
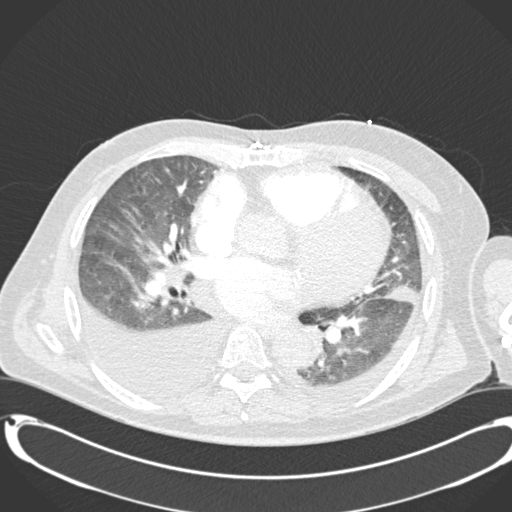
[im 194/319  soft-tissue]
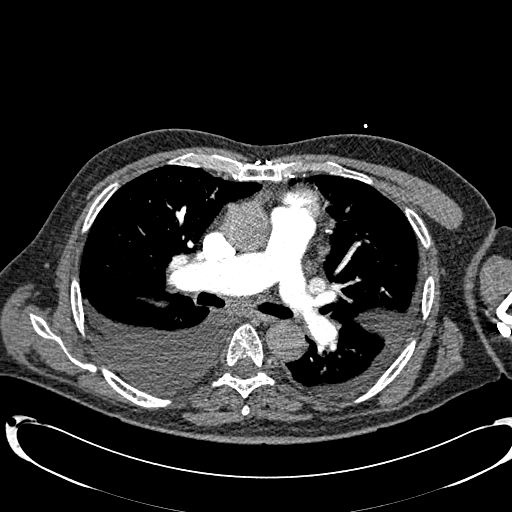
[im 208/319  lung]
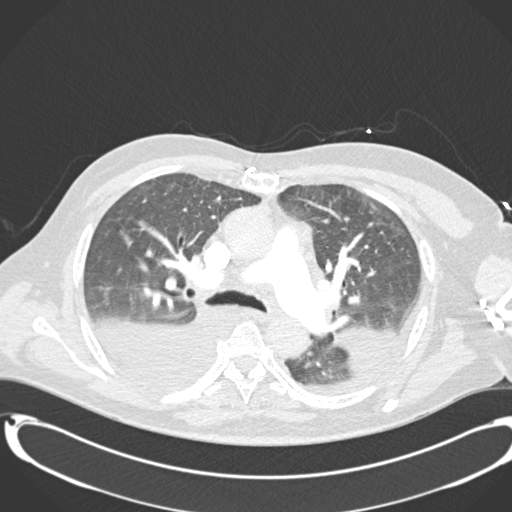
[im 222/319  soft-tissue]
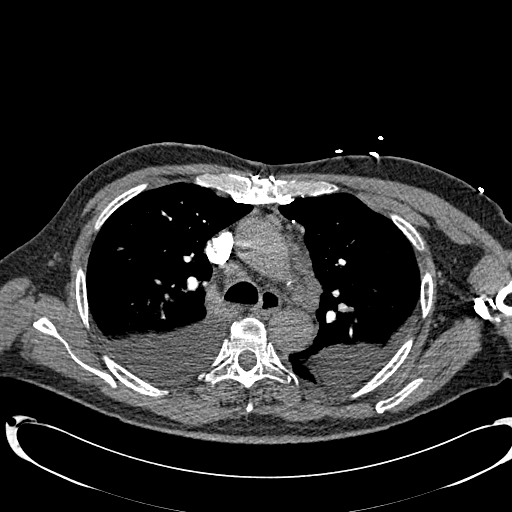
[im 249/319  lung]
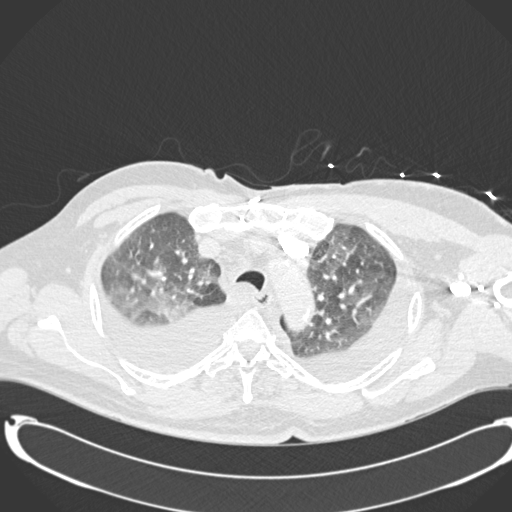
[im 263/319  soft-tissue]
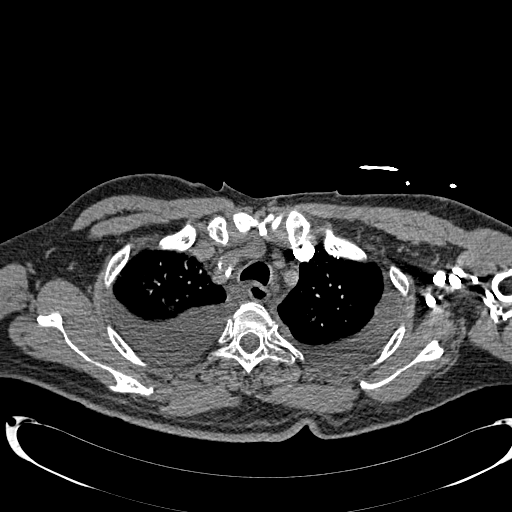
[im 277/319  lung]
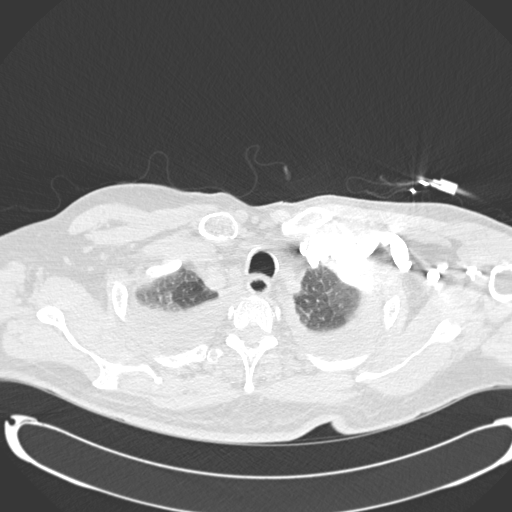
[im 305/319  soft-tissue]
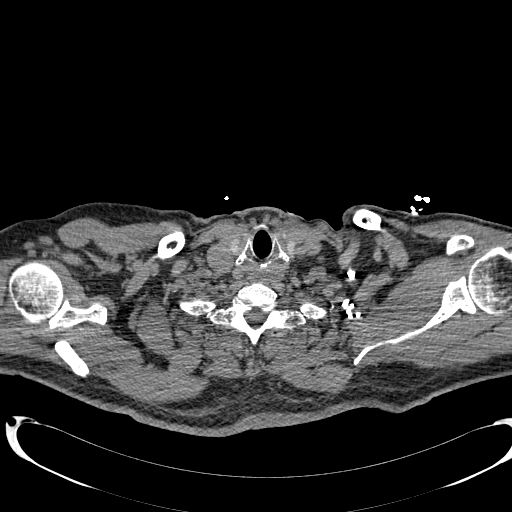

[Series 602: cor · coronal · 0.77mm/px · 3 of 118 slices shown]
[im 30/118  soft-tissue]
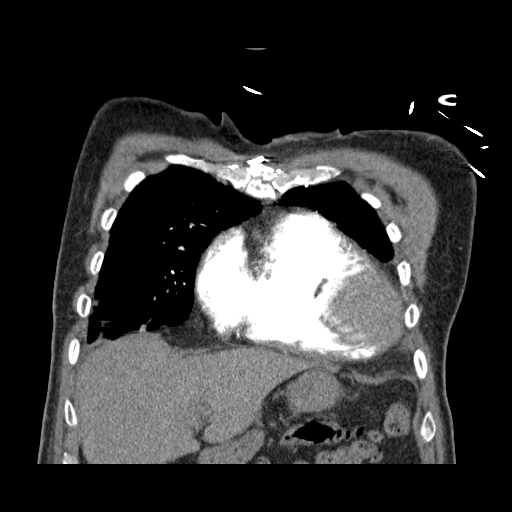
[im 59/118  soft-tissue]
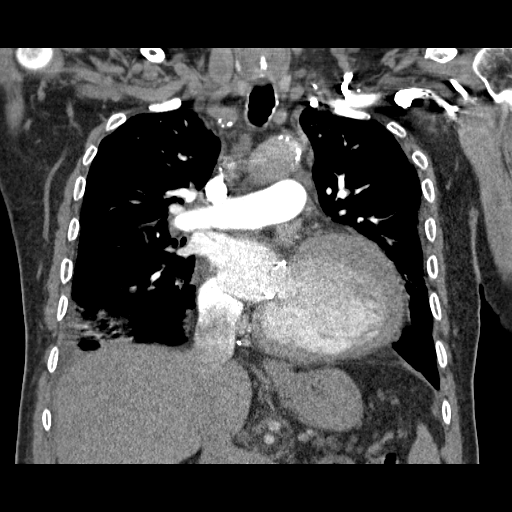
[im 88/118  soft-tissue]
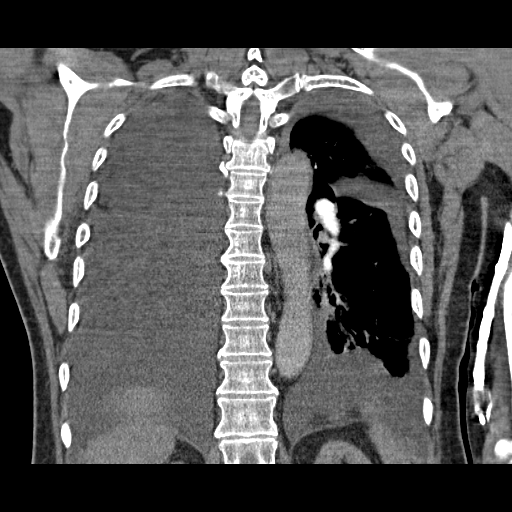

[19 of 46 positions shown; findings below may reference images not displayed]

FINDINGS: No main or lobar branch pulmonary embolism.  Suboptimal
evaluation of the segmental branches.  The heart is enlarged.
Coronary artery calcification.  Mitral valve replacement.
Scattered atherosclerotic calcification of the aorta.  No
dissection or aneurysmal dilatation.  Status post median
sternotomy.  Large right and moderate left pleural effusions.
Associated consolidations; atelectasis versus pneumonia.  There is
mild bilateral ground glass opacity with interlobular septal
thickening, in keeping with pulmonary edema.  The central airways
are patent.

Limited images through the upper abdomen demonstrate no acute
abnormality.

Multilevel degenerative changes of the imaged spine. No acute or
aggressive appearing osseous lesion.

Review of the MIP images confirms the above findings.
IMPRESSION: No main or lobar branch pulmonary embolism.  Suboptimal evaluation
of the segmental branches.

Status post median sternotomy and mitral valve replacement.

Cardiomegaly with bilateral pleural effusions and a pulmonary edema
pattern.

## 2012-10-05 ENCOUNTER — Other Ambulatory Visit: Payer: Self-pay | Admitting: *Deleted

## 2012-10-05 MED ORDER — SIMVASTATIN 10 MG PO TABS: 10.0000 mg | ORAL_TABLET | Freq: Every day | ORAL | Status: DC

## 2012-10-06 ENCOUNTER — Ambulatory Visit (INDEPENDENT_AMBULATORY_CARE_PROVIDER_SITE_OTHER): Payer: 59

## 2012-10-06 DIAGNOSIS — Z23 Encounter for immunization: Secondary | ICD-10-CM

## 2012-10-20 ENCOUNTER — Other Ambulatory Visit: Payer: Self-pay | Admitting: *Deleted

## 2012-10-20 DIAGNOSIS — I5022 Chronic systolic (congestive) heart failure: Secondary | ICD-10-CM

## 2012-10-20 MED ORDER — FUROSEMIDE 40 MG PO TABS: 40.0000 mg | ORAL_TABLET | Freq: Two times a day (BID) | ORAL | Status: DC

## 2012-10-29 ENCOUNTER — Other Ambulatory Visit: Payer: Self-pay | Admitting: *Deleted

## 2012-10-29 MED ORDER — CARVEDILOL 6.25 MG PO TABS: 6.2500 mg | ORAL_TABLET | Freq: Two times a day (BID) | ORAL | Status: DC

## 2012-11-10 ENCOUNTER — Other Ambulatory Visit: Payer: Self-pay | Admitting: *Deleted

## 2012-11-10 MED ORDER — LOSARTAN POTASSIUM 100 MG PO TABS: 100.0000 mg | ORAL_TABLET | Freq: Every day | ORAL | Status: DC

## 2012-11-23 ENCOUNTER — Other Ambulatory Visit: Payer: Self-pay

## 2012-11-23 MED ORDER — SIMVASTATIN 10 MG PO TABS: 10.0000 mg | ORAL_TABLET | Freq: Every day | ORAL | Status: DC

## 2012-12-02 ENCOUNTER — Ambulatory Visit: Payer: 59 | Admitting: Nurse Practitioner

## 2012-12-04 ENCOUNTER — Ambulatory Visit: Payer: 59 | Admitting: Physician Assistant

## 2012-12-15 ENCOUNTER — Encounter: Payer: Self-pay | Admitting: Nurse Practitioner

## 2012-12-15 ENCOUNTER — Ambulatory Visit (INDEPENDENT_AMBULATORY_CARE_PROVIDER_SITE_OTHER): Payer: 59 | Admitting: Nurse Practitioner

## 2012-12-15 VITALS — BP 140/100 | HR 60 | Ht 69.0 in | Wt 204.8 lb

## 2012-12-15 DIAGNOSIS — I5022 Chronic systolic (congestive) heart failure: Secondary | ICD-10-CM

## 2012-12-15 LAB — CBC WITH DIFFERENTIAL/PLATELET
Basophils Absolute: 0 10*3/uL (ref 0.0–0.1)
Basophils Relative: 0.5 % (ref 0.0–3.0)
Eosinophils Absolute: 0.1 10*3/uL (ref 0.0–0.7)
Eosinophils Relative: 1.6 % (ref 0.0–5.0)
HCT: 42.2 % (ref 39.0–52.0)
Hemoglobin: 13.9 g/dL (ref 13.0–17.0)
Lymphocytes Relative: 30.8 % (ref 12.0–46.0)
Lymphs Abs: 2.2 10*3/uL (ref 0.7–4.0)
MCHC: 32.9 g/dL (ref 30.0–36.0)
MCV: 90 fl (ref 78.0–100.0)
Monocytes Absolute: 0.5 10*3/uL (ref 0.1–1.0)
Monocytes Relative: 6.4 % (ref 3.0–12.0)
Neutro Abs: 4.3 10*3/uL (ref 1.4–7.7)
Neutrophils Relative %: 60.7 % (ref 43.0–77.0)
Platelets: 190 10*3/uL (ref 150.0–400.0)
RBC: 4.69 Mil/uL (ref 4.22–5.81)
RDW: 13.6 % (ref 11.5–14.6)
WBC: 7.1 10*3/uL (ref 4.5–10.5)

## 2012-12-15 LAB — BASIC METABOLIC PANEL
BUN: 25 mg/dL — ABNORMAL HIGH (ref 6–23)
CO2: 28 mEq/L (ref 19–32)
Calcium: 8.9 mg/dL (ref 8.4–10.5)
Chloride: 103 mEq/L (ref 96–112)
Creatinine, Ser: 1 mg/dL (ref 0.4–1.5)
GFR: 80.37 mL/min (ref >60.00)
Glucose, Bld: 127 mg/dL — ABNORMAL HIGH (ref 70–99)
Potassium: 4.9 mEq/L (ref 3.5–5.1)
Sodium: 138 mEq/L (ref 135–145)

## 2012-12-15 LAB — HEPATIC FUNCTION PANEL
ALT: 15 U/L (ref 0–53)
AST: 22 U/L (ref 0–37)
Albumin: 3.9 g/dL (ref 3.5–5.2)
Alkaline Phosphatase: 47 U/L (ref 39–117)
Bilirubin, Direct: 0 mg/dL (ref 0.0–0.3)
Total Bilirubin: 0.8 mg/dL (ref 0.3–1.2)
Total Protein: 7.1 g/dL (ref 6.0–8.3)

## 2012-12-15 LAB — LIPID PANEL
Cholesterol: 231 mg/dL — ABNORMAL HIGH (ref 0–200)
HDL: 38.7 mg/dL — ABNORMAL LOW (ref >39.00)
Total CHOL/HDL Ratio: 6
Triglycerides: 358 mg/dL — ABNORMAL HIGH (ref 0.0–149.0)
VLDL: 71.6 mg/dL — ABNORMAL HIGH (ref 0.0–40.0)

## 2012-12-15 LAB — TSH: TSH: 2.36 u[IU]/mL (ref 0.35–5.50)

## 2012-12-15 LAB — LDL CHOLESTEROL, DIRECT: Direct LDL: 131.6 mg/dL

## 2012-12-15 MED ORDER — CARVEDILOL 12.5 MG PO TABS: 12.5000 mg | ORAL_TABLET | Freq: Two times a day (BID) | ORAL | Status: DC

## 2012-12-15 MED ORDER — LOSARTAN POTASSIUM 100 MG PO TABS: 100.0000 mg | ORAL_TABLET | Freq: Every day | ORAL | Status: DC

## 2012-12-15 MED ORDER — SIMVASTATIN 10 MG PO TABS: 10.0000 mg | ORAL_TABLET | Freq: Every day | ORAL | Status: DC

## 2012-12-15 MED ORDER — FUROSEMIDE 40 MG PO TABS: 40.0000 mg | ORAL_TABLET | Freq: Two times a day (BID) | ORAL | Status: DC

## 2012-12-15 NOTE — Patient Instructions (Addendum)
I have refilled your medicines today  Your Coreg is increased to 12.5 mg two times a day  We will check labs today (CBC, BMET, HPF, Lipids, TSH)  Avoid salt  Stay active  See Dr. Johney Frame in 6 months.  Let us know if you change your mind about having another ultrasound of your heart.  Call the [facility] [address] office at [phone] if you have any questions, problems or concerns.

## 2012-12-15 NOTE — Progress Notes (Signed)
Jordan Carter Date of Birth: 02-01-1946 Medical Record #[id number]  History of Present Illness: Jordan Carter is seen back today for a follow up visit. He is seen for Dr. Johney Carter. Has not been seen since November of 2012. Cancelled his echo and follow up for February of 2013. Has an ischemic cardiomyopathy with an EF of 20%, remote CABG in 2007 in [place], LBBB, HTN, s/p MV repair in 2007 in [place], HLD and glucose intolerance. Has had medical noncompliance as well.  He has not been seen in over one year.   He comes in today. He is here with his significant other, Jordan Carter. He says he is doing well. No symptoms reported. No chest pain. Not short of breath. Not dizzy or lightheaded. Weight is up. He says it is from over eating. Not interested in having an echo. Says his heart "is doing just fine".   Current Outpatient Prescriptions on File Prior to Visit  Medication Sig Dispense Refill  . aspirin 325 MG EC tablet Take 325 mg by mouth daily.        . furosemide (LASIX) 40 MG tablet Take 1 tablet (40 mg total) by mouth 2 (two) times daily.  180 tablet  3  . losartan (COZAAR) 100 MG tablet Take 1 tablet (100 mg total) by mouth daily.  90 tablet  3  . Multiple Vitamin (MULTIVITAMIN) tablet Take 1 tablet by mouth daily.        . simvastatin (ZOCOR) 10 MG tablet Take 1 tablet (10 mg total) by mouth at bedtime.  90 tablet  3    No Known Allergies  Past Medical History  Diagnosis Date  . Chronic systolic heart failure     Ischemic CM; Echo 8/12: mild LVH, EF 20%, mild MR, mild LAE, mod RAE, mod RVE, mod reduced RVSF, PASP 44;  admx to hosp 07/2011  . CAD (coronary artery disease)     s/p CABG in [place]  . LBBB (left bundle branch block)   . Hypertension   . Ischemic cardiomyopathy   . Mitral valve disease     s/p MV repair 2007 in [place]  . Medical non-compliance   . Glucose intolerance (impaired glucose tolerance)   . HLD (hyperlipidemia)     Past Surgical History  Procedure Date    . Coronary artery bypass graft 2005  . Appendectomy   . Mitral valve repair     History  Smoking status  . Former Smoker  Smokeless tobacco  . Not on file    History  Alcohol Use  . 1.8 oz/week  . 3 Glasses of wine per week    Comment: RARELY    Family History  Problem Relation Age of Onset  . Hyperlipidemia Father   . Hypertension Father   . Heart disease Father   . Cancer Sister     Review of Systems: The review of systems is per the HPI.  All other systems were reviewed and are negative.  Physical Exam: BP 140/100  Pulse 60  Ht 5\' 9"  (1.753 m)  Wt 204 lb 12.8 oz (92.897 kg)  BMI 30.24 kg/m2 Patient is alert and in no acute distress. Skin is warm and dry. Color is normal.  HEENT is unremarkable. Normocephalic/atraumatic. PERRL. Sclera are nonicteric. Neck is supple. No masses. No JVD. Lungs are clear. Cardiac exam shows a regular rate and rhythm. Abdomen is soft. Extremities are without edema. Gait and ROM are intact. No gross neurologic deficits noted.  LABORATORY DATA:  Pending for today.   Lab Results  Component Value Date   WBC 6.3 08/11/2011   HGB 13.6 08/11/2011   HCT 41.7 08/11/2011   PLT 210 08/11/2011   GLUCOSE 118* 09/27/2011   CHOL 168 08/10/2011   TRIG 104 08/10/2011   HDL 39* 08/10/2011   LDLCALC 108* 08/10/2011   ALT 34 08/09/2011   AST 33 08/09/2011   NA 140 09/27/2011   K 4.7 09/27/2011   CL 103 09/27/2011   CREATININE 0.9 09/27/2011   BUN 28* 09/27/2011   CO2 30 09/27/2011   TSH 3.166 08/10/2011   PSA 1.40 10/14/2011   INR 1.17 08/09/2011   HGBA1C 6.5 10/14/2011    Assessment / Plan:  1. Ischemic CM - last measurement of EF was 20% back in 2012. Cancelled his echo back last February. He is not interested in having further studies. Medicines are refilled today, but Coreg is increased to 12.5 mg BID  2. CAD - remote CABG with MV repair - no symptoms.  3. HLD - needs repeat labs today. He is fasting. Will defer testosterone testing to his  PCP.  4. HTN - Blood pressure still elevated. Coreg is increased. He does not monitor at home.   I will have him see Dr. Johney Carter in 6 months.   Patient is agreeable to this plan and will call if any problems develop in the interim.

## 2012-12-21 ENCOUNTER — Telehealth: Payer: Self-pay | Admitting: Internal Medicine

## 2012-12-21 ENCOUNTER — Encounter: Payer: Self-pay | Admitting: Nurse Practitioner

## 2012-12-21 ENCOUNTER — Encounter: Payer: Self-pay | Admitting: Internal Medicine

## 2012-12-21 NOTE — Telephone Encounter (Signed)
New Problem: ° ° ° °Patient called in returning a call.  Please call back. °

## 2012-12-21 NOTE — Telephone Encounter (Signed)
Follow-up:    Patient returned your call.  Please call back and feel free to leave a complete message.

## 2012-12-22 ENCOUNTER — Telehealth: Payer: Self-pay | Admitting: *Deleted

## 2012-12-22 DIAGNOSIS — E785 Hyperlipidemia, unspecified: Secondary | ICD-10-CM

## 2012-12-22 NOTE — Telephone Encounter (Signed)
Spoke with POA, lab results given, discussed diet and exercise at length, pt wants to try to reduce fats and sugars to see if lipids will reduce, pt is on prescribed dose of simvastatin, I gave them a 3 month lab date and sent orders for Norma Fredrickson NP for bmet and lipid.

## 2012-12-22 NOTE — Telephone Encounter (Signed)
Message copied by Tarri Fuller on Tue Dec 22, 2012 11:21 AM ------      Message from: Rosalio Macadamia      Created: Tue Dec 15, 2012  2:38 PM       Ok to report. Labs are satisfactory except for very elevated triglycerides and LDL. ? Taking the Zocor. Would he consider increasing. Needs to limit sugars from his diet and work on diet/weight loss/exercise.

## 2012-12-22 NOTE — Telephone Encounter (Signed)
s/w Felicity Coyer who states she is pt's care giver. Harriett Sine aware of lab results and states fine to increase statin. She asked what could pt do to make #'s better. Advised watch fatty fried foods, carbs, sugars, limit surgary drinks, eat more fruits/veggies

## 2013-03-24 ENCOUNTER — Other Ambulatory Visit: Payer: 59

## 2013-10-21 ENCOUNTER — Other Ambulatory Visit: Payer: Self-pay

## 2014-01-10 ENCOUNTER — Other Ambulatory Visit: Payer: Self-pay | Admitting: Internal Medicine

## 2014-01-10 ENCOUNTER — Other Ambulatory Visit: Payer: Self-pay | Admitting: Nurse Practitioner

## 2014-02-08 ENCOUNTER — Other Ambulatory Visit: Payer: Self-pay | Admitting: Internal Medicine

## 2014-03-10 ENCOUNTER — Other Ambulatory Visit: Payer: Self-pay | Admitting: Internal Medicine

## 2014-04-07 ENCOUNTER — Other Ambulatory Visit: Payer: Self-pay | Admitting: Internal Medicine

## 2014-05-13 ENCOUNTER — Ambulatory Visit (INDEPENDENT_AMBULATORY_CARE_PROVIDER_SITE_OTHER): Payer: 59 | Admitting: Nurse Practitioner

## 2014-05-13 ENCOUNTER — Encounter: Payer: Self-pay | Admitting: Nurse Practitioner

## 2014-05-13 VITALS — BP 142/88 | HR 66 | Ht 69.0 in | Wt 214.4 lb

## 2014-05-13 DIAGNOSIS — I255 Ischemic cardiomyopathy: Secondary | ICD-10-CM

## 2014-05-13 DIAGNOSIS — I5022 Chronic systolic (congestive) heart failure: Secondary | ICD-10-CM

## 2014-05-13 DIAGNOSIS — R7309 Other abnormal glucose: Secondary | ICD-10-CM

## 2014-05-13 DIAGNOSIS — I2589 Other forms of chronic ischemic heart disease: Secondary | ICD-10-CM

## 2014-05-13 DIAGNOSIS — R7303 Prediabetes: Secondary | ICD-10-CM

## 2014-05-13 LAB — CBC
HCT: 40.9 % (ref 39.0–52.0)
Hemoglobin: 13.6 g/dL (ref 13.0–17.0)
MCHC: 33.2 g/dL (ref 30.0–36.0)
MCV: 89.6 fl (ref 78.0–100.0)
Platelets: 183 10*3/uL (ref 150.0–400.0)
RBC: 4.57 Mil/uL (ref 4.22–5.81)
RDW: 14.3 % (ref 11.5–15.5)
WBC: 7 10*3/uL (ref 4.0–10.5)

## 2014-05-13 LAB — LIPID PANEL
Cholesterol: 221 mg/dL — ABNORMAL HIGH (ref 0–200)
HDL: 36.5 mg/dL — ABNORMAL LOW (ref >39.00)
LDL Cholesterol: 95 mg/dL (ref 0–99)
Total CHOL/HDL Ratio: 6
Triglycerides: 448 mg/dL — ABNORMAL HIGH (ref 0.0–149.0)
VLDL: 89.6 mg/dL — ABNORMAL HIGH (ref 0.0–40.0)

## 2014-05-13 LAB — TSH: TSH: 0.69 u[IU]/mL (ref 0.35–4.50)

## 2014-05-13 LAB — BASIC METABOLIC PANEL
BUN: 25 mg/dL — ABNORMAL HIGH (ref 6–23)
CO2: 32 mEq/L (ref 19–32)
Calcium: 9.1 mg/dL (ref 8.4–10.5)
Chloride: 100 mEq/L (ref 96–112)
Creatinine, Ser: 1.1 mg/dL (ref 0.4–1.5)
GFR: 73.96 mL/min (ref >60.00)
Glucose, Bld: 100 mg/dL — ABNORMAL HIGH (ref 70–99)
Potassium: 4.2 mEq/L (ref 3.5–5.1)
Sodium: 139 mEq/L (ref 135–145)

## 2014-05-13 LAB — HEPATIC FUNCTION PANEL
ALT: 20 U/L (ref 0–53)
AST: 21 U/L (ref 0–37)
Albumin: 3.7 g/dL (ref 3.5–5.2)
Alkaline Phosphatase: 42 U/L (ref 39–117)
Bilirubin, Direct: 0 mg/dL (ref 0.0–0.3)
Total Bilirubin: 0.4 mg/dL (ref 0.2–1.2)
Total Protein: 6.6 g/dL (ref 6.0–8.3)

## 2014-05-13 LAB — HEMOGLOBIN A1C: Hgb A1c MFr Bld: 6.3 % (ref 4.6–6.5)

## 2014-05-13 MED ORDER — FUROSEMIDE 40 MG PO TABS: 40.0000 mg | ORAL_TABLET | Freq: Two times a day (BID) | ORAL | Status: DC

## 2014-05-13 MED ORDER — CARVEDILOL 12.5 MG PO TABS: 12.5000 mg | ORAL_TABLET | Freq: Two times a day (BID) | ORAL | Status: DC

## 2014-05-13 MED ORDER — LOSARTAN POTASSIUM 100 MG PO TABS: 100.0000 mg | ORAL_TABLET | Freq: Every day | ORAL | Status: DC

## 2014-05-13 MED ORDER — SIMVASTATIN 10 MG PO TABS: 10.0000 mg | ORAL_TABLET | Freq: Every day | ORAL | Status: DC

## 2014-05-13 NOTE — Patient Instructions (Addendum)
Continue with your current medicines  We will refill your medicines today  We will check labs today  See Dr. Johney Frame in one year  Call the [facility] office at [phone] if you have any questions, problems or concerns.

## 2014-05-13 NOTE — Progress Notes (Signed)
Jordan Carter Date of Birth: 01/26/1946 Medical Record #[id number]  History of Present Illness: Jordan Carter is seen back today for a 2 1/2 year check - seen for Jordan Carter.   He has a history of ischemic cardiomyopathy with an EF of 20%, remote coronary artery bypass grafting in 2007 while in [place], left bundle branch block, hypertension, status post mitral valve repair in 2007 also in [place], hyperlipidemia and glucose intolerance. He has had medical noncompliance.  Not seen since 2013. I have tried to get a repeat echo but he has not wanted to pursue.  Comes back today.  Here with his significant other - Felicity CoyerNancy Carter. Needs medicines refilled. No recent labs. He feels fine. No interest in checking echo or having other tests done. No chest pain or shortness of breath. His weight is going up.    Current Outpatient Prescriptions  Medication Sig Dispense Refill  . aspirin 325 MG EC tablet Take 325 mg by mouth daily.        Marland Kitchen. b complex vitamins capsule Take 1 capsule by mouth daily.      . carvedilol (COREG) 12.5 MG tablet TAKE 1 TABLET BY MOUTH TWICE DAILY  60 tablet  0  . furosemide (LASIX) 40 MG tablet Take 1 tablet (40 mg total) by mouth 2 (two) times daily.  180 tablet  3  . Multiple Vitamin (MULTIVITAMIN) tablet Take 1 tablet by mouth daily.        . simvastatin (ZOCOR) 10 MG tablet Take 1 tablet (10 mg total) by mouth at bedtime.  90 tablet  3  . losartan (COZAAR) 100 MG tablet Take 1 tablet (100 mg total) by mouth daily.  90 tablet  3   No current facility-administered medications for this visit.    No Known Allergies  Past Medical History  Diagnosis Date  . Chronic systolic heart failure     Ischemic CM; Echo 8/12: mild LVH, EF 20%, mild MR, mild LAE, mod RAE, mod RVE, mod reduced RVSF, PASP 44;  admx to hosp 07/2011  . CAD (coronary artery disease)     s/p CABG in [place] 2007  . LBBB (left bundle branch block)   . Hypertension   . Ischemic cardiomyopathy   . Mitral valve  disease     s/p MV repair 2007 in [place]  . Medical non-compliance   . Glucose intolerance (impaired glucose tolerance)   . HLD (hyperlipidemia)     Past Surgical History  Procedure Laterality Date  . Coronary artery bypass graft  2005  . Appendectomy    . Mitral valve repair      History  Smoking status  . Former Smoker  Smokeless tobacco  . Not on file    History  Alcohol Use  . 1.8 oz/week  . 3 Glasses of wine per week    Comment: RARELY    Family History  Problem Relation Age of Onset  . Hyperlipidemia Father   . Hypertension Father   . Heart disease Father   . Cancer Sister     Review of Systems: The review of systems is per the HPI.  All other systems were reviewed and are negative.  Physical Exam: BP 142/88  Pulse 66  Ht 5\' 9"  (1.753 m)  Wt 214 lb 6.4 oz (97.251 kg)  BMI 31.65 kg/m2  SpO2 98% Patient is very pleasant and in no acute distress. Skin is warm and dry. Color is normal.  HEENT is unremarkable. Normocephalic/atraumatic. PERRL. Sclera are nonicteric.  Neck is supple. No masses. No JVD. Lungs are clear. Cardiac exam shows a regular rate and rhythm. Abdomen is soft. Extremities are without edema. Gait and ROM are intact. No gross neurologic deficits noted.  Wt Readings from Last 3 Encounters:  05/13/14 214 lb 6.4 oz (97.251 kg)  12/15/12 204 lb 12.8 oz (92.897 kg)  03/26/12 192 lb (87.091 kg)     LABORATORY DATA: Pending   Lab Results  Component Value Date   WBC 7.1 12/15/2012   HGB 13.9 12/15/2012   HCT 42.2 12/15/2012   PLT 190.0 12/15/2012   GLUCOSE 127* 12/15/2012   CHOL 231* 12/15/2012   TRIG 358.0* 12/15/2012   HDL 38.70* 12/15/2012   LDLDIRECT 131.6 12/15/2012   LDLCALC 108* 08/10/2011   ALT 15 12/15/2012   AST 22 12/15/2012   NA 138 12/15/2012   K 4.9 12/15/2012   CL 103 12/15/2012   CREATININE 1.0 12/15/2012   BUN 25* 12/15/2012   CO2 28 12/15/2012   TSH 2.36 12/15/2012   PSA 1.40 10/14/2011   INR 1.17 08/09/2011    HGBA1C 6.5 10/14/2011     Assessment / Plan:  1. Ischemic CM - seems compensated - not interested in other therapies or testing.  2. HLD - on statin - no labs and not fasting but will check today due to the fact we probably won't get him back in here.  3. CAD - remote CABG  See Jordan Carter in one year. I have once again suggested testing but he is not interested.  Patient is agreeable to this plan and will call if any problems develop in the interim.   Rosalio Macadamia, RN, ANP-C [facility] [address] [place], [place]  [address] [phone]

## 2014-05-16 ENCOUNTER — Telehealth: Payer: Self-pay | Admitting: Nurse Practitioner

## 2014-05-16 NOTE — Telephone Encounter (Signed)
New message ° ° ° ° °Want blood work results °

## 2015-03-04 ENCOUNTER — Other Ambulatory Visit: Payer: Self-pay | Admitting: Nurse Practitioner

## 2015-05-08 ENCOUNTER — Other Ambulatory Visit: Payer: Self-pay | Admitting: Nurse Practitioner

## 2015-05-09 NOTE — Telephone Encounter (Signed)
Per note 5.29.16

## 2015-08-05 ENCOUNTER — Other Ambulatory Visit: Payer: Self-pay | Admitting: Internal Medicine

## 2015-09-07 ENCOUNTER — Other Ambulatory Visit: Payer: Self-pay | Admitting: Internal Medicine

## 2015-09-07 ENCOUNTER — Other Ambulatory Visit: Payer: Self-pay

## 2015-09-07 MED ORDER — LOSARTAN POTASSIUM 100 MG PO TABS: 100.0000 mg | ORAL_TABLET | Freq: Every day | ORAL | Status: DC

## 2015-09-07 MED ORDER — FUROSEMIDE 40 MG PO TABS: 40.0000 mg | ORAL_TABLET | Freq: Two times a day (BID) | ORAL | Status: DC

## 2015-09-07 MED ORDER — CARVEDILOL 12.5 MG PO TABS: ORAL_TABLET | ORAL | Status: DC

## 2015-09-07 NOTE — Telephone Encounter (Signed)
Follow up office visit rescheduled until 09/2015- explained that if appointment not kept that no more refills will be issued Rosalio Macadamia, NP at 05/13/2014 1:16 PM  carvedilol (COREG) 12.5 MG tablet TAKE 1 TABLET BY MOUTH TWICE DAILY          losartan (COZAAR) 100 MG tablet Take 1 tablet (100 mg total) by mouth da          furosemide (LASIX) 40 MG tablet Take 1 tablet (40 mg total) by mouth 2 (two) times daily         Patient Instructions     Continue with your current medicines  We will refill your medicines today

## 2015-09-13 ENCOUNTER — Ambulatory Visit: Payer: 59 | Admitting: Nurse Practitioner

## 2015-10-03 ENCOUNTER — Ambulatory Visit (INDEPENDENT_AMBULATORY_CARE_PROVIDER_SITE_OTHER): Payer: 59 | Admitting: Nurse Practitioner

## 2015-10-03 ENCOUNTER — Encounter: Payer: Self-pay | Admitting: Nurse Practitioner

## 2015-10-03 VITALS — BP 170/100 | HR 76 | Ht 69.0 in | Wt 202.4 lb

## 2015-10-03 DIAGNOSIS — I255 Ischemic cardiomyopathy: Secondary | ICD-10-CM | POA: Diagnosis not present

## 2015-10-03 DIAGNOSIS — R7303 Prediabetes: Secondary | ICD-10-CM | POA: Diagnosis not present

## 2015-10-03 DIAGNOSIS — I5022 Chronic systolic (congestive) heart failure: Secondary | ICD-10-CM

## 2015-10-03 DIAGNOSIS — E785 Hyperlipidemia, unspecified: Secondary | ICD-10-CM

## 2015-10-03 LAB — BASIC METABOLIC PANEL
BUN: 17 mg/dL (ref 7–25)
CO2: 27 mmol/L (ref 20–31)
Calcium: 9.3 mg/dL (ref 8.6–10.3)
Chloride: 99 mmol/L (ref 98–110)
Creat: 0.89 mg/dL (ref 0.70–1.25)
Glucose, Bld: 122 mg/dL — ABNORMAL HIGH (ref 65–99)
Potassium: 4.6 mmol/L (ref 3.5–5.3)
Sodium: 138 mmol/L (ref 135–146)

## 2015-10-03 MED ORDER — LOSARTAN POTASSIUM 100 MG PO TABS: 100.0000 mg | ORAL_TABLET | Freq: Every day | ORAL | Status: DC

## 2015-10-03 MED ORDER — SIMVASTATIN 10 MG PO TABS: 10.0000 mg | ORAL_TABLET | Freq: Every day | ORAL | Status: AC

## 2015-10-03 MED ORDER — FUROSEMIDE 40 MG PO TABS: 40.0000 mg | ORAL_TABLET | Freq: Every day | ORAL | Status: AC

## 2015-10-03 MED ORDER — CARVEDILOL 25 MG PO TABS: 25.0000 mg | ORAL_TABLET | Freq: Two times a day (BID) | ORAL | Status: DC

## 2015-10-03 NOTE — Progress Notes (Signed)
CARDIOLOGY OFFICE NOTE  Date:  10/03/2015    Jordan Carter Date of Birth: 10-Oct-1946 Medical Record #[id number]  PCP:  Jordan Linger, MD  Cardiologist:  Jordan Carter    Chief Complaint  Patient presents with  . Cardiomyopathy    1 1/2 year check - seen for Dr. Johney Carter    History of Present Illness: Jordan Carter is a 69 y.o. male who presents today for a 1 1/2 year check. Seen for Dr. Johney Carter. He has a history of ischemic cardiomyopathy with an EF of 20%, remote coronary artery bypass grafting in 2007 while in [place], left bundle branch block, hypertension, status post mitral valve repair in 2007 also in [place], hyperlipidemia and glucose intolerance. He has had long standing medical noncompliance.  Last seen in May of 2015 after a 2 year absence. Was not interested in getting any follow up. Weight going up. Only wanted his medicines refilled.   Comes back today. Here with his significant other - Jordan Carter - she is is POA. Refuses to get EKG - has not had in over 4 years. Almost out of his medicines. BP up today - is running 140 to 150 at home. No chest pain. Not short of breath. He says he is exercising and that it is going ok. He tells me he is here with the expectation that I will stop all of his medicines - particularly since he is exercising and feeling well.   Past Medical History  Diagnosis Date  . Chronic systolic heart failure (HCC)     Ischemic CM; Echo 8/12: mild LVH, EF 20%, mild MR, mild LAE, mod RAE, mod RVE, mod reduced RVSF, PASP 44;  admx to hosp 07/2011  . CAD (coronary artery disease)     s/p CABG in [place]  . LBBB (left bundle branch block)   . Hypertension   . Ischemic cardiomyopathy   . Mitral valve disease     s/p MV repair 2007 in [place]  . Medical non-compliance   . Glucose intolerance (impaired glucose tolerance)   . HLD (hyperlipidemia)     Past Surgical History  Procedure Laterality Date  . Coronary artery bypass graft  2005  . Appendectomy      . Mitral valve repair       Medications: Current Outpatient Prescriptions  Medication Sig Dispense Refill  . aspirin 325 MG EC tablet Take 325 mg by mouth daily.      Marland Kitchen b complex vitamins capsule Take 1 capsule by mouth daily.    . carvedilol (COREG) 25 MG tablet Take 1 tablet (25 mg total) by mouth 2 (two) times daily. 180 tablet 3  . furosemide (LASIX) 40 MG tablet Take 1 tablet (40 mg total) by mouth daily. 90 tablet 3  . losartan (COZAAR) 100 MG tablet Take 1 tablet (100 mg total) by mouth daily. 90 tablet 3  . Multiple Vitamin (MULTIVITAMIN) tablet Take 1 tablet by mouth daily.      . simvastatin (ZOCOR) 10 MG tablet Take 1 tablet (10 mg total) by mouth at bedtime. 90 tablet 3   No current facility-administered medications for this visit.    Allergies: No Known Allergies  Social History: The patient  reports that he has quit smoking. He does not have any smokeless tobacco history on file. He reports that he drinks about 1.8 oz of alcohol per week. He reports that he does not use illicit drugs.   Family History: The patient's family history includes Cancer  in his sister; Heart disease in his father; Hyperlipidemia in his father; Hypertension in his father.   Review of Systems: Please see the history of present illness.   Otherwise, the review of systems is positive for none.   All other systems are reviewed and negative.   Physical Exam: VS:  BP 170/100 mmHg  Pulse 76  Ht 5\' 9"  (1.753 m)  Wt 202 lb 6.4 oz (91.808 kg)  BMI 29.88 kg/m2  SpO2 97% .  BMI Body mass index is 29.88 kg/(m^2).  Wt Readings from Last 3 Encounters:  10/03/15 202 lb 6.4 oz (91.808 kg)  05/13/14 214 lb 6.4 oz (97.251 kg)  12/15/12 204 lb 12.8 oz (92.897 kg)   BP by me is 180/100  General: Alert and in no acute distress.  HEENT: Normal. Neck: Supple, no JVD, carotid bruits, or masses noted.  Cardiac: Regular rate and rhythm. +S3. No edema.  Respiratory:  Lungs are clear to auscultation  bilaterally with normal work of breathing.  GI: Soft and nontender.  MS: No deformity or atrophy. Gait and ROM intact. Skin: Warm and dry. Color is normal.  Neuro:  Strength and sensation are intact and no gross focal deficits noted.  Psych: Alert, appropriate and with normal affect.   LABORATORY DATA:  EKG:  EKG is ordered today. The patient refused to have it completed.   Lab Results  Component Value Date   WBC 7.0 05/13/2014   HGB 13.6 05/13/2014   HCT 40.9 05/13/2014   PLT 183.0 05/13/2014   GLUCOSE 100* 05/13/2014   CHOL 221* 05/13/2014   TRIG * 05/13/2014    448.0 Triglyceride is over 400; calculations on Lipids are invalid.   HDL 36.50* 05/13/2014   LDLDIRECT 131.6 12/15/2012   LDLCALC 95 05/13/2014   ALT 20 05/13/2014   AST 21 05/13/2014   NA 139 05/13/2014   K 4.2 05/13/2014   CL 100 05/13/2014   CREATININE 1.1 05/13/2014   BUN 25* 05/13/2014   CO2 32 05/13/2014   TSH 0.69 05/13/2014   PSA 1.40 10/14/2011   INR 1.17 08/09/2011   HGBA1C 6.3 05/13/2014    BNP (last 3 results) No results for input(s): BNP in the last 8760 hours.  ProBNP (last 3 results) No results for input(s): PROBNP in the last 8760 hours.   Other Studies Reviewed Today: Impression Exercise Capacity: Adenosine study with no exercise. BP Response: Normal blood pressure response. Clinical Symptoms: No symptoms.  ECG Impression: Baseline: LBBB. EKG uninterpretable due to LBBB at rest and stress. Comparison with Prior Nuclear Study: No previous nuclear study performed  Overall Impression: Severe LV systolic function, EF 22%, with global hypokinesis and paradoxical septal motion consistent with LBBB. There was a moderate fixed basal to mid inferior perfusion defect and a small fixed apical perfusion defect. These findings are suggestive of infarction without significant ischemia.   Jordan Carter  Assessment/Plan: 1. Ischemic CM -  not interested in other therapies or  testing.  2. HLD - on statin - labs from May are reviewed.   3. CAD - remote CABG - denies symptoms.   4. HTN - BP elevated. Only has fair control at home and not at goal. Coreg increased to 25 mg BID.   See Dr. Johney FrameAllred in one year. I have once again suggested testing (would have at least liked to have had EKG) but he is again, not interested. Recheck his BMET today. He was very resistant to increase his Coreg today but BP is  elevated.  I really do not think I have anything else to offer to him. He can see one of the other providers going forward and maybe they can be of more assistance to him.   Current medicines are reviewed with the patient today.  The patient does not have concerns regarding medicines other than what has been noted above.  The following changes have been made:  See above.  Labs/ tests ordered today include:    Orders Placed This Encounter  Procedures  . Basic metabolic panel     Disposition:   FU with Dr. Johney Carter or other APP in one year.   Patient is agreeable to this plan and will call if any problems develop in the interim.   Signed: Rosalio Macadamia, RN, ANP-C 10/03/2015 2:10 PM  [facility] [address] [place], [place]  [address] Phone: [phone] Fax: [phone]

## 2015-10-03 NOTE — Patient Instructions (Addendum)
We will be checking the following labs today - BMET  Medication Instructions:    Continue with your current medicines. But  I am increasing the Coreg to 25 mg twice a day  Your refills are at the drug store.    Testing/Procedures To Be Arranged:  N/A  Follow-Up:   See Dr. Johney FrameAllred in one year.    Other Special Instructions:   N/A  Call the [facility] office at [phone] if you have any questions, problems or concerns.

## 2015-10-05 ENCOUNTER — Other Ambulatory Visit: Payer: Self-pay | Admitting: Internal Medicine

## 2016-05-09 ENCOUNTER — Other Ambulatory Visit: Payer: Self-pay | Admitting: Nurse Practitioner

## 2016-05-30 LAB — HEPATIC FUNCTION PANEL
ALT: 14 U/L (ref 10–40)
AST: 18 U/L (ref 14–40)
Alkaline Phosphatase: 56 U/L (ref 25–125)

## 2016-05-30 LAB — BASIC METABOLIC PANEL
BUN: 16 mg/dL (ref 4–21)
Creatinine: 0.8 mg/dL (ref 0.6–1.3)
Glucose: 123 mg/dL
Potassium: 4.8 mmol/L (ref 3.4–5.3)
Sodium: 142 mmol/L (ref 137–147)

## 2016-05-30 LAB — LIPID PANEL
CHOLESTEROL: 242 mg/dL — AB (ref 0–200)
HDL: 43 mg/dL (ref 35–70)
LDL CALC: 156 mg/dL
Triglycerides: 213 mg/dL — AB (ref 40–160)

## 2016-05-30 LAB — CBC AND DIFFERENTIAL
HEMATOCRIT: 43 % (ref 41–53)
Hemoglobin: 14.3 g/dL (ref 13.5–17.5)
NEUTROS ABS: 4 /uL
Platelets: 219 10*3/uL (ref 150–399)
WBC: 6.1 10^3/mL

## 2016-05-30 LAB — TSH: TSH: 4.43 u[IU]/mL (ref 0.41–5.90)

## 2016-07-16 ENCOUNTER — Telehealth: Payer: Self-pay | Admitting: Internal Medicine

## 2016-07-16 NOTE — Telephone Encounter (Signed)
Left vm for Jordan Carter to call back and schedule an appt 30 min appt with [name].

## 2016-07-16 NOTE — Telephone Encounter (Signed)
Ok with me 

## 2016-07-16 NOTE — Telephone Encounter (Signed)
Jordan Carter, Jordan Carter, called request Jordan Carter to reestablish with Dr. [name]. Last ov was 03/2012. She said Jordan Carter has some loss stool and she is very concern about this. Please advise?

## 2016-07-23 ENCOUNTER — Encounter: Payer: Self-pay | Admitting: Internal Medicine

## 2016-07-23 ENCOUNTER — Ambulatory Visit (INDEPENDENT_AMBULATORY_CARE_PROVIDER_SITE_OTHER): Payer: 59 | Admitting: Internal Medicine

## 2016-07-23 ENCOUNTER — Other Ambulatory Visit (INDEPENDENT_AMBULATORY_CARE_PROVIDER_SITE_OTHER): Payer: 59

## 2016-07-23 VITALS — BP 150/90 | HR 69 | Temp 98.0°F | Resp 16 | Ht 69.0 in | Wt 195.5 lb

## 2016-07-23 DIAGNOSIS — A047 Enterocolitis due to Clostridium difficile: Secondary | ICD-10-CM | POA: Diagnosis not present

## 2016-07-23 DIAGNOSIS — E032 Hypothyroidism due to medicaments and other exogenous substances: Secondary | ICD-10-CM

## 2016-07-23 DIAGNOSIS — A09 Infectious gastroenteritis and colitis, unspecified: Secondary | ICD-10-CM | POA: Diagnosis not present

## 2016-07-23 DIAGNOSIS — A0472 Enterocolitis due to Clostridium difficile, not specified as recurrent: Secondary | ICD-10-CM

## 2016-07-23 DIAGNOSIS — R197 Diarrhea, unspecified: Secondary | ICD-10-CM

## 2016-07-23 LAB — COMPREHENSIVE METABOLIC PANEL
ALBUMIN: 4.1 g/dL (ref 3.5–5.2)
ALK PHOS: 51 U/L (ref 39–117)
ALT: 20 U/L (ref 0–53)
AST: 20 U/L (ref 0–37)
BILIRUBIN TOTAL: 0.5 mg/dL (ref 0.2–1.2)
BUN: 24 mg/dL — AB (ref 6–23)
CO2: 28 mEq/L (ref 19–32)
Calcium: 9.3 mg/dL (ref 8.4–10.5)
Chloride: 101 mEq/L (ref 96–112)
Creatinine, Ser: 0.88 mg/dL (ref 0.40–1.50)
GFR: 91.09 mL/min (ref >60.00)
GLUCOSE: 107 mg/dL — AB (ref 70–99)
Potassium: 4.5 mEq/L (ref 3.5–5.1)
SODIUM: 138 meq/L (ref 135–145)
TOTAL PROTEIN: 7 g/dL (ref 6.0–8.3)

## 2016-07-23 LAB — SEDIMENTATION RATE: Sed Rate: 12 mm/hr (ref 0–20)

## 2016-07-23 LAB — CBC WITH DIFFERENTIAL/PLATELET
BASOS ABS: 0 10*3/uL (ref 0.0–0.1)
Basophils Relative: 0.4 % (ref 0.0–3.0)
EOS PCT: 1.6 % (ref 0.0–5.0)
Eosinophils Absolute: 0.1 10*3/uL (ref 0.0–0.7)
HCT: 42.3 % (ref 39.0–52.0)
HEMOGLOBIN: 14 g/dL (ref 13.0–17.0)
LYMPHS ABS: 1.7 10*3/uL (ref 0.7–4.0)
Lymphocytes Relative: 24.4 % (ref 12.0–46.0)
MCHC: 33.1 g/dL (ref 30.0–36.0)
MCV: 87.5 fl (ref 78.0–100.0)
MONO ABS: 0.5 10*3/uL (ref 0.1–1.0)
MONOS PCT: 7.3 % (ref 3.0–12.0)
NEUTROS PCT: 66.3 % (ref 43.0–77.0)
Neutro Abs: 4.6 10*3/uL (ref 1.4–7.7)
Platelets: 205 10*3/uL (ref 150.0–400.0)
RBC: 4.83 Mil/uL (ref 4.22–5.81)
RDW: 14.9 % (ref 11.5–15.5)
WBC: 7 10*3/uL (ref 4.0–10.5)

## 2016-07-23 LAB — TSH: TSH: 3.04 u[IU]/mL (ref 0.35–4.50)

## 2016-07-23 NOTE — Progress Notes (Signed)
Subjective:  Patient ID: Jordan Carter, male    DOB: October 13, 1946  Age: 70 y.o. MRN: [id number]  CC: Diarrhea   HPI Jordan Carter presents for aseveral week history of diarrhea. He reports about 3-4 loose bowel movements a day. He is not aware of any causes such as certain foods, recent antibiotic therapy, or recent travel. He denies systemic symptoms such as nausea, vomiting, fever, chills, abdominal pain, cramping, foul odor, or blood in stool.  Outpatient Medications Prior to Visit  Medication Sig Dispense Refill  . aspirin 325 MG EC tablet Take 325 mg by mouth daily.      Marland Kitchen b complex vitamins capsule Take 1 capsule by mouth daily.    . furosemide (LASIX) 40 MG tablet Take 1 tablet (40 mg total) by mouth daily. 90 tablet 3  . losartan (COZAAR) 100 MG tablet Take 1 tablet (100 mg total) by mouth daily. 90 tablet 3  . Multiple Vitamin (MULTIVITAMIN) tablet Take 1 tablet by mouth daily.      . simvastatin (ZOCOR) 10 MG tablet Take 1 tablet (10 mg total) by mouth at bedtime. 90 tablet 3  . carvedilol (COREG) 25 MG tablet Take 1 tablet (25 mg total) by mouth 2 (two) times daily. 180 tablet 3  . carvedilol (COREG) 25 MG tablet Take 1 tablet (25 mg total) by mouth 2 (two) times daily with a meal. 60 tablet 5   No facility-administered medications prior to visit.     ROS Review of Systems  Constitutional: Negative.  Negative for activity change, appetite change, chills and fatigue.  HENT: Negative.  Negative for sore throat and trouble swallowing.   Eyes: Negative.  Negative for visual disturbance.  Respiratory: Negative for cough, choking, chest tightness, shortness of breath and stridor.   Cardiovascular: Negative.  Negative for chest pain, palpitations and leg swelling.  Gastrointestinal: Positive for diarrhea. Negative for abdominal pain, blood in stool, constipation, nausea and vomiting.  Endocrine: Negative.   Genitourinary: Negative.   Musculoskeletal: Negative.   Skin: Negative.     Allergic/Immunologic: Negative.   Neurological: Negative.  Negative for dizziness.  Hematological: Negative.  Negative for adenopathy. Does not bruise/bleed easily.  Psychiatric/Behavioral: Negative.     Objective:  BP (!) 150/90 (BP Location: Left Arm, Patient Position: Sitting, Cuff Size: Normal)   Pulse 69   Temp 98 F (36.7 C) (Oral)   Resp 16   Ht 5\' 9"  (1.753 m)   Wt 195 lb 8 oz (88.7 kg)   SpO2 97%   BMI 28.87 kg/m   BP Readings from Last 3 Encounters:  07/23/16 (!) 150/90  10/03/15 (!) 170/100  05/13/14 (!) 142/88    Wt Readings from Last 3 Encounters:  07/23/16 195 lb 8 oz (88.7 kg)  10/03/15 202 lb 6.4 oz (91.8 kg)  05/13/14 214 lb 6.4 oz (97.3 kg)    Physical Exam  Constitutional: He is oriented to person, place, and time. No distress.  HENT:  Mouth/Throat: Oropharynx is clear and moist. No oropharyngeal exudate.  Eyes: Conjunctivae are normal. Right eye exhibits no discharge. Left eye exhibits no discharge. No scleral icterus.  Neck: Normal range of motion. Neck supple. No JVD present. No tracheal deviation present. No thyromegaly present.  Cardiovascular: Normal rate, regular rhythm, normal heart sounds and intact distal pulses.  Exam reveals no gallop and no friction rub.   No murmur heard. Pulmonary/Chest: Effort normal and breath sounds normal. No stridor. No respiratory distress. He has no wheezes. He has no  rales. He exhibits no tenderness.  Abdominal: Soft. Bowel sounds are normal. He exhibits no distension. There is no tenderness. There is no rebound and no guarding.  Musculoskeletal: Normal range of motion. He exhibits no edema, tenderness or deformity.  Lymphadenopathy:    He has no cervical adenopathy.  Neurological: He is oriented to person, place, and time.  Skin: Skin is warm and dry. No rash noted. He is not diaphoretic. No erythema. No pallor.  Vitals reviewed.   Lab Results  Component Value Date   WBC 7.0 07/23/2016   HGB 14.0  07/23/2016   HCT 42.3 07/23/2016   PLT 205.0 07/23/2016   GLUCOSE 107 (H) 07/23/2016   CHOL 242 (A) 05/30/2016   TRIG 213 (A) 05/30/2016   HDL 43 05/30/2016   LDLDIRECT 131.6 12/15/2012   LDLCALC 156 05/30/2016   ALT 20 07/23/2016   AST 20 07/23/2016   NA 138 07/23/2016   K 4.5 07/23/2016   CL 101 07/23/2016   CREATININE 0.88 07/23/2016   BUN 24 (H) 07/23/2016   CO2 28 07/23/2016   TSH 3.04 07/23/2016   PSA 1.40 10/14/2011   INR 1.17 08/09/2011   HGBA1C 6.3 05/13/2014    Dg Chest 2 View  Result Date: 03/26/2012 *RADIOLOGY REPORT* Clinical Data: History of pneumonia.  Follow-up. CHEST - 2 VIEW Comparison: 03/19/2012. Findings: There is interval clearing of the right upper lobe infiltrative density.  No new infiltrates are evident.  There is slight flattening of diaphragm on lateral image suggesting minimal hyperinflation is present.  There is moderate cardiac silhouette enlargement which is stable. Ectasia and nonaneurysmal calcification of the thoracic aorta are seen. 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  There is minimal degenerative spondylosis. IMPRESSION: Interval clearing of right upper lobe infiltrative density.  Stable cardiac silhouette enlargement.  No new or acute superimposed process is identified.  Chronic findings are stable and are detailed above. Original Report Authenticated By: Crawford Givens, M.D.   Assessment & Plan:   Jordan Carter was seen today for diarrhea.  Diagnoses and all orders for this visit:  Diarrhea of presumed infectious origin- his CBC, CMP, sedimentation rate, and lactoferrin are normal, will screen for celiac disease, his PCR for C. difficile is positive so will treat that. -     CBC with Differential/Platelet; Future -     Comprehensive metabolic panel; Future -     Gliadin antibodies, serum -     Tissue transglutaminase, IgA -     Reticulin Antibody, IgA w reflex titer -     Fecal lactoferrin,  quant; Future -     Ova and parasite examination; Future -     Sedimentation rate; Future -     Gastrointestinal Pathogen Panel PCR; Future -     metroNIDAZOLE (FLAGYL) 500 MG tablet; Take 1 tablet (500 mg total) by mouth 3 (three) times daily.  Hypothyroidism due to medication- his TSH is in the normal range, will continue the current dose of levothyroxine. -     TSH; Future  Intestinal infection due to Clostridium difficile- his stool test is PCR positive for C. difficile, will treat with a seven-day course of metronidazole. -     metroNIDAZOLE (FLAGYL) 500 MG tablet; Take 1 tablet (500 mg total) by mouth 3 (three) times daily.   I am having Jordan Carter start on metroNIDAZOLE. I am also having him maintain his aspirin, multivitamin, b complex vitamins, furosemide, simvastatin, losartan, carvedilol, and thyroid.  Meds ordered  this encounter  Medications  . carvedilol (COREG) 12.5 MG tablet    Sig: Take 12.5 mg by mouth 2 (two) times daily with a meal.  . thyroid (ARMOUR) 30 MG tablet    Sig: Take 30 mg by mouth daily before breakfast.  . metroNIDAZOLE (FLAGYL) 500 MG tablet    Sig: Take 1 tablet (500 mg total) by mouth 3 (three) times daily.    Dispense:  21 tablet    Refill:  0     Follow-up: Return in about 3 weeks (around 08/13/2016).  Sanda Lingerhomas Harlon Kutner, MD

## 2016-07-23 NOTE — Progress Notes (Signed)
Pre visit review using our clinic review tool, if applicable. No additional management support is needed unless otherwise documented below in the visit note. 

## 2016-07-23 NOTE — Patient Instructions (Signed)

## 2016-07-24 ENCOUNTER — Other Ambulatory Visit: Payer: 59

## 2016-07-24 DIAGNOSIS — R197 Diarrhea, unspecified: Secondary | ICD-10-CM

## 2016-07-25 LAB — OVA AND PARASITE EXAMINATION: OP: NONE SEEN

## 2016-07-25 LAB — FECAL LACTOFERRIN, QUANT: LACTOFERRIN: NEGATIVE

## 2016-07-26 ENCOUNTER — Encounter: Payer: Self-pay | Admitting: Internal Medicine

## 2016-07-26 LAB — GASTROINTESTINAL PATHOGEN PANEL PCR
C. difficile Tox A/B, PCR: DETECTED — CR
CAMPYLOBACTER, PCR: NOT DETECTED
Cryptosporidium, PCR: NOT DETECTED
E COLI (ETEC) LT/ST, PCR: NOT DETECTED
E COLI 0157, PCR: NOT DETECTED
E coli (STEC) stx1/stx2, PCR: NOT DETECTED
Giardia lamblia, PCR: NOT DETECTED
Norovirus, PCR: NOT DETECTED
Rotavirus A, PCR: NOT DETECTED
SALMONELLA, PCR: NOT DETECTED
SHIGELLA, PCR: NOT DETECTED

## 2016-07-27 ENCOUNTER — Encounter: Payer: Self-pay | Admitting: Internal Medicine

## 2016-07-27 DIAGNOSIS — E032 Hypothyroidism due to medicaments and other exogenous substances: Secondary | ICD-10-CM | POA: Insufficient documentation

## 2016-07-27 DIAGNOSIS — A0472 Enterocolitis due to Clostridium difficile, not specified as recurrent: Secondary | ICD-10-CM | POA: Insufficient documentation

## 2016-07-27 MED ORDER — METRONIDAZOLE 500 MG PO TABS: 500.0000 mg | ORAL_TABLET | Freq: Three times a day (TID) | ORAL | 0 refills | Status: AC

## 2016-07-29 ENCOUNTER — Telehealth: Payer: Self-pay

## 2016-07-29 NOTE — Telephone Encounter (Signed)
shalisha with [facility] called to report critical lab----patient is positive for c-diff-----routing to dr Yetta Barrejones, please advise, thanks

## 2016-08-01 ENCOUNTER — Encounter: Payer: Self-pay | Admitting: Family Medicine

## 2016-08-07 ENCOUNTER — Encounter: Payer: Self-pay | Admitting: Internal Medicine

## 2016-08-09 ENCOUNTER — Other Ambulatory Visit: Payer: Self-pay | Admitting: Internal Medicine

## 2016-08-09 DIAGNOSIS — R197 Diarrhea, unspecified: Secondary | ICD-10-CM

## 2016-10-01 ENCOUNTER — Encounter: Payer: Self-pay | Admitting: Internal Medicine

## 2016-10-13 ENCOUNTER — Other Ambulatory Visit: Payer: Self-pay | Admitting: Nurse Practitioner

## 2016-10-14 ENCOUNTER — Other Ambulatory Visit: Payer: Self-pay | Admitting: Nurse Practitioner

## 2016-10-21 ENCOUNTER — Telehealth: Payer: Self-pay | Admitting: Emergency Medicine

## 2016-10-21 NOTE — Telephone Encounter (Signed)
[employer] called and would like to know if you would be willing to sign the pts death certificate. Please advise thanks.

## 2016-10-21 NOTE — Telephone Encounter (Signed)
Please advise 

## 2016-10-21 NOTE — Telephone Encounter (Signed)
yes

## 2016-10-21 NOTE — Telephone Encounter (Signed)
error 

## 2016-10-22 NOTE — Telephone Encounter (Signed)
Funeral home contacted and informed of PCP response.

## 2016-10-23 ENCOUNTER — Telehealth: Payer: Self-pay

## 2016-10-23 NOTE — Telephone Encounter (Signed)
On 10/23/2016 I received a death certificate from [employer] [facility] The death certificate is for cremation. The patient is a patient of Doctor Sanda Lingerhomas Jones. The death certificate will be taken to [facility] this am for signature.  On 10/23/2016 I received the death certificate back from Doctor Sanda Lingerhomas Jones. I got the death certificate ready and called the funeral home to let them know the death certificate is ready for pickup. I also faxed a copy to the funeral home per the funeral home request.

## 2016-11-15 DEATH — deceased
